# Patient Record
Sex: Male | Born: 1970 | Race: White | Hispanic: No | Marital: Married | Smoking: Never smoker
Health system: Southern US, Community
[De-identification: ages and names within clinical notes are randomized; demographics above are authoritative.]

## PROBLEM LIST (undated history)

## (undated) DIAGNOSIS — F32A Depression, unspecified: Secondary | ICD-10-CM

## (undated) DIAGNOSIS — F419 Anxiety disorder, unspecified: Secondary | ICD-10-CM

## (undated) HISTORY — DX: Depression, unspecified: F32.A

## (undated) HISTORY — PX: WISDOM TOOTH EXTRACTION: SHX21

## (undated) HISTORY — DX: Anxiety disorder, unspecified: F41.9

---

## 2005-01-28 ENCOUNTER — Inpatient Hospital Stay (HOSPITAL_COMMUNITY): Admission: EM | Admit: 2005-01-28 | Discharge: 2005-01-31 | Payer: Self-pay | Admitting: Emergency Medicine

## 2007-01-22 IMAGING — CT CT NECK W/ CM
3 series · 8 of 14 positions shown, 9 images · IV contrast (100 ML OMNI 300)
Comparison: none

CLINICAL DATA: Recent left lower wisdom tooth removal with development of dental abscess and severe pain. 
NECK CT WITH CONTRAST:
TECHNIQUE: Multidetector CT imaging of the neck was performed following the standard protocol during administration of intravenous contrast.
Contrast:  100 cc Omnipaque 300 IV. 
CT demonstrates extensive multiloculated abscess formation at the site of tooth extraction involving tooth #17 of the left mandible.  At the site of tooth extraction, breakthrough is noted in the cortex of the mandible, both along its outer and inner aspects. Extensive adjacent abscess formation is present in the soft tissues, particularly deep to the mandible where a component of abscess measures 2.9 x 5.2 cm in greatest transverse dimensions.   Abscess also tracts up superiorly lateral to the mandible along the plain of the masseter muscle with maximum height of the lateral abscess measuring 5 cm on coronal reconstructions. The abscess also borders upon the anterior aspect of the parotid gland.  The deep portion of the abscess does cause mass effect on the airway to the right without significant narrowing of the oral pharyngeal airway.  The paranasal sinuses show normal aeration.

[Series 2: neck · axial · 0.43mm/px · z∈[-260,-173]mm · 2 of 69 slices shown, 3 images]
[im 23/69  soft-tissue]
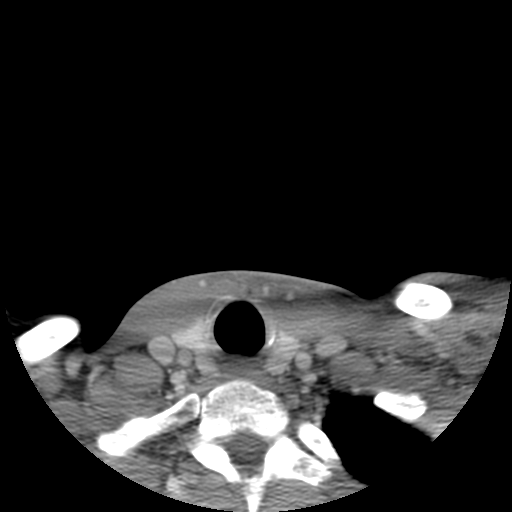
[im 23/69  bone]
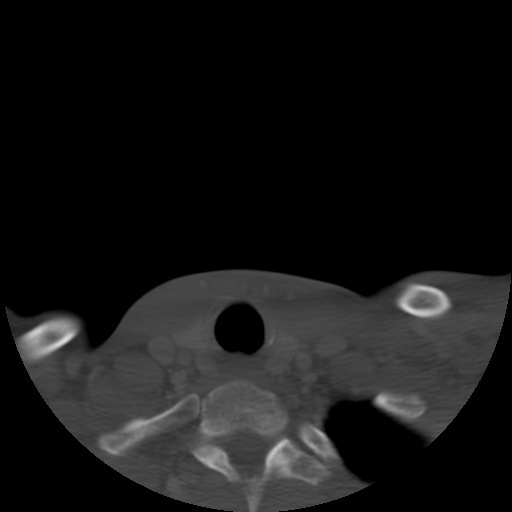
[im 46/69  bone]
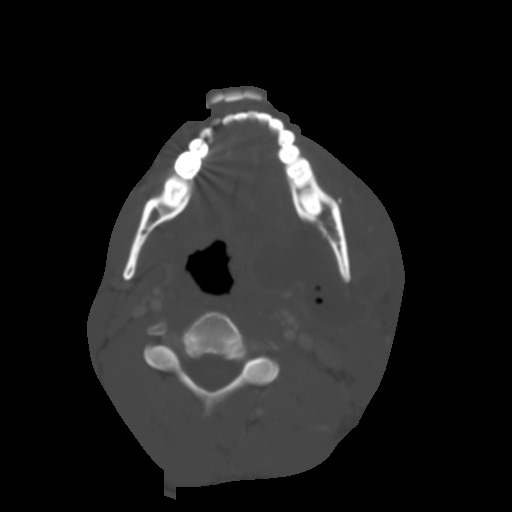

[Series 103: reformatted · sagittal · 0.43mm/px · 3 of 85 slices shown (1 of 2)]
[im 22/85  bone]
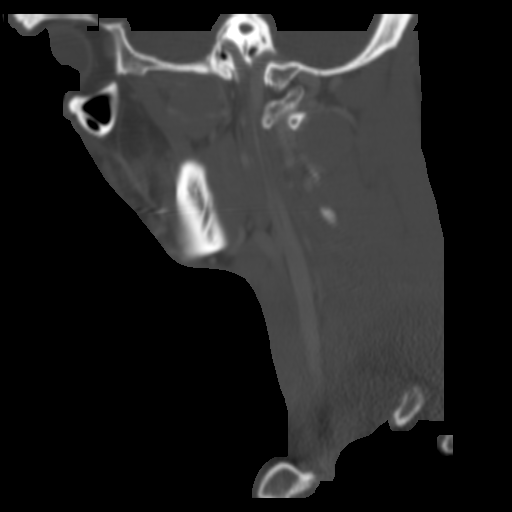
[im 43/85  bone]
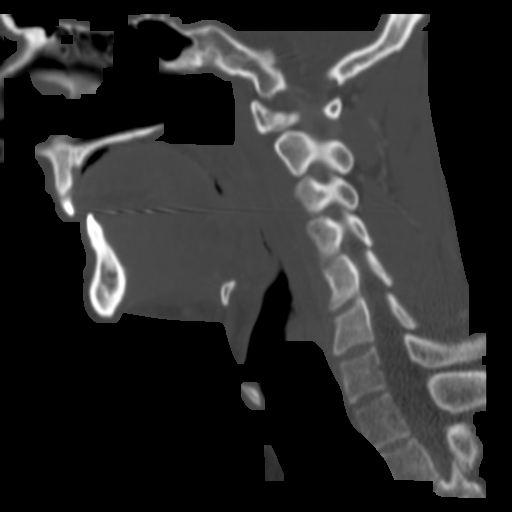
[im 64/85  bone]
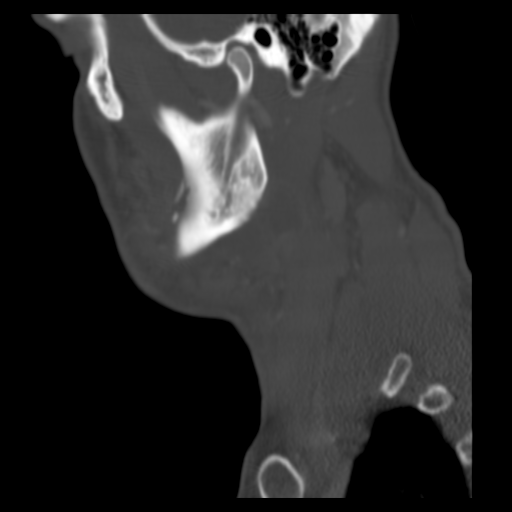

[Series 104: reformatted · coronal · 0.43mm/px · 3 of 93 slices shown (2 of 2)]
[im 24/93  bone]
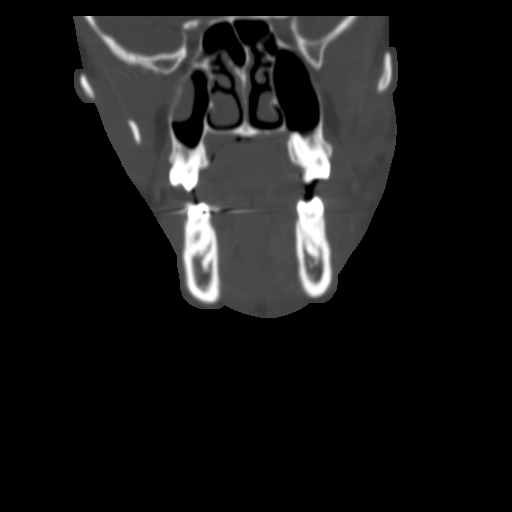
[im 47/93  bone]
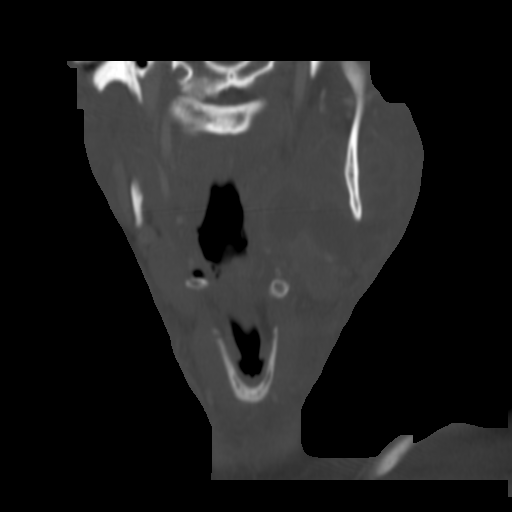
[im 70/93  bone]
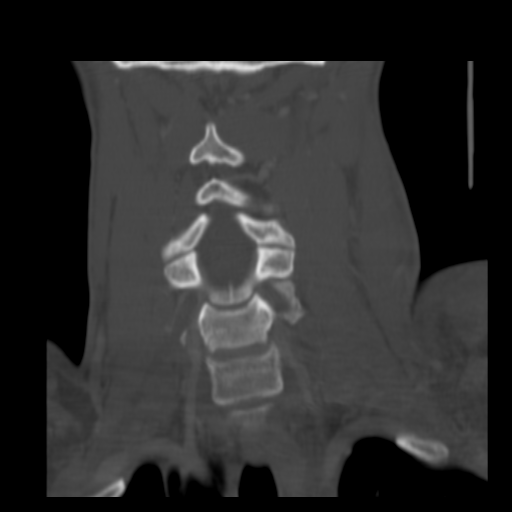

[8 of 14 positions shown; findings below may reference images not displayed]

IMPRESSION: Extensive abscess formation centered around the extracted wisdom molar #17 of the left mandible.  Predominant abscess fluid is deep and medial to the mandible with lateral component also ascending in the face.  There is visible cortical breakthrough at the site of tooth extraction, both along the deep and superficial aspects of the mandible.   Mass effect exerted on the airway without significant airway narrowing.  
Findings were reviewed directly with Dr. Desmond Kofi at the time of the study.

## 2013-06-05 ENCOUNTER — Encounter (HOSPITAL_COMMUNITY): Payer: Self-pay | Admitting: Emergency Medicine

## 2013-06-05 ENCOUNTER — Emergency Department (HOSPITAL_COMMUNITY)
Admission: EM | Admit: 2013-06-05 | Discharge: 2013-06-05 | Disposition: A | Discharge status: Home / Self Care | Payer: BC Managed Care – PPO | Attending: Emergency Medicine | Admitting: Emergency Medicine

## 2013-06-05 DIAGNOSIS — W268XXA Contact with other sharp object(s), not elsewhere classified, initial encounter: Secondary | ICD-10-CM | POA: Insufficient documentation

## 2013-06-05 DIAGNOSIS — Y929 Unspecified place or not applicable: Secondary | ICD-10-CM | POA: Insufficient documentation

## 2013-06-05 DIAGNOSIS — Y9389 Activity, other specified: Secondary | ICD-10-CM | POA: Insufficient documentation

## 2013-06-05 DIAGNOSIS — S61019A Laceration without foreign body of unspecified thumb without damage to nail, initial encounter: Secondary | ICD-10-CM

## 2013-06-05 DIAGNOSIS — S61209A Unspecified open wound of unspecified finger without damage to nail, initial encounter: Secondary | ICD-10-CM | POA: Insufficient documentation

## 2013-06-05 NOTE — ED Notes (Signed)
Pt given water and crackers with peanut butter per EDNP request.  Tolerating well.

## 2013-06-05 NOTE — ED Notes (Signed)
Pt has laceration to left thumb, seen at urgent care told to come here for eval of nerve damage. Thumb wrapped in Kerlex, no bleeding.

## 2013-06-05 NOTE — ED Provider Notes (Signed)
CSN: [id number]     Arrival date & time 06/05/13  1806 History  This chart was scribed for non-physician practitioner, Glendell Docker, FNP,working with Blanchard Kelch, MD, by Marlowe Kays, ED Scribe.  This patient was seen in room WTR5/WTR5 and the patient's care was started at 6:20 PM.  Chief Complaint  Patient presents with  . Laceration   The history is provided by the patient. No language interpreter was used.   HPI Comments:  Jonathan [name] is a 43 y.o. male who presents to the Emergency Department complaining of a laceration to his left thumb while slicing an avocado approximately one hour ago. Pt states he went to [facility] and they referred him here because they were concerned about him having "nerve damage". He reports moderate bleeding that is now controlled. He denies weakness, numbness or tingling of the thumb. He states his last tetanus vaccination was five years ago in 2010.   History reviewed. No pertinent past medical history. Past Surgical History  Procedure Laterality Date  . Wisdom tooth extraction     No family history on file. History  Substance Use Topics  . Smoking status: Never Smoker   . Smokeless tobacco: Not on file  . Alcohol Use: No    Review of Systems  Skin: Positive for wound (laceration to left thumb).  Neurological: Negative for weakness and numbness.  All other systems reviewed and are negative.   Allergies  Review of patient's allergies indicates no known allergies.  Home Medications   Prior to Admission medications   Not on File   Triage Vitals: BP 107/69  Pulse 62  Temp(Src) 98.5 F (36.9 C) (Oral)  SpO2 100% Physical Exam  Nursing note and vitals reviewed. Constitutional: He is oriented to person, place, and time. He appears well-developed and well-nourished.  HENT:  Head: Normocephalic and atraumatic.  Eyes: EOM are normal.  Neck: Normal range of motion.  Cardiovascular: Normal rate, regular rhythm and  normal heart sounds.   Pulmonary/Chest: Effort normal and breath sounds normal. No respiratory distress. He has no wheezes. He has no rales.  Musculoskeletal: Normal range of motion.  Normal ROM of the thumb.  Neurological: He is alert and oriented to person, place, and time.  Sensation intact.  Skin: Skin is warm and dry.  Psychiatric: He has a normal mood and affect. His behavior is normal.    ED Course  Procedures (including critical care time) DIAGNOSTIC STUDIES: Oxygen Saturation is 100% on RA, normal by my interpretation.   COORDINATION OF CARE: 6:24 PM- Will numb the finger and further evaluate if DermaBond or sutures are needed. Pt verbalizes understanding and agrees to plan.  LACERATION REPAIR PROCEDURE NOTE The patient's identification was confirmed and consent was obtained. This procedure was performed by Glendell Docker, FNP at 6:28 PM. Site: right thumb pad Sterile procedures observed Anesthetic used (type and amt): Lidocaine 1% without Epinephrine (2 mL) Suture type/size: DermaBond Length: 2.5 cm  Complexity: simple Tetanus UTD Site anesthetized, irrigated with NS, explored without evidence of foreign body, wound well approximated, site covered with dry, sterile dressing.  Patient tolerated procedure well without complications. Instructions for care discussed verbally and patient provided with additional written instructions for homecare and f/u.  Medications - No data to display  Labs Review Labs Reviewed - No data to display  Imaging Review No results found.   EKG Interpretation None      MDM   Final diagnoses:  Thumb laceration  Wound cleaned and closed without any problem. Pt is okay to follow up as needed:nuerovascularly intact  I personally performed the services described in this documentation, which was scribed in my presence. The recorded information has been reviewed and is accurate.    Glendell Docker, NP 06/05/13 1933

## 2013-06-05 NOTE — Discharge Instructions (Signed)
Laceration Care, Adult °A laceration is a cut that goes through all layers of the skin. The cut goes into the tissue beneath the skin. °HOME CARE °For stitches (sutures) or staples: °· Keep the cut clean and dry. °· If you have a bandage (dressing), change it at least once a day. Change the bandage if it gets wet or dirty, or as told by your doctor. °· Wash the cut with soap and water 2 times a day. Rinse the cut with water. Pat it dry with a clean towel. °· Put a thin layer of medicated cream on the cut as told by your doctor. °· You may shower after the first 24 hours. Do not soak the cut in water until the stitches are removed. °· Only take medicines as told by your doctor. °· Have your stitches or staples removed as told by your doctor. °For skin adhesive strips: °· Keep the cut clean and dry. °· Do not get the strips wet. You may take a bath, but be careful to keep the cut dry. °· If the cut gets wet, pat it dry with a clean towel. °· The strips will fall off on their own. Do not remove the strips that are still stuck to the cut. °For wound glue: °· You may shower or take baths. Do not soak or scrub the cut. Do not swim. Avoid heavy sweating until the glue falls off on its own. After a shower or bath, pat the cut dry with a clean towel. °· Do not put medicine on your cut until the glue falls off. °· If you have a bandage, do not put tape over the glue. °· Avoid lots of sunlight or tanning lamps until the glue falls off. Put sunscreen on the cut for the first year to reduce your scar. °· The glue will fall off on its own. Do not pick at the glue. °You may need a tetanus shot if: °· You cannot remember when you had your last tetanus shot. °· You have never had a tetanus shot. °If you need a tetanus shot and you choose not to have one, you may get tetanus. Sickness from tetanus can be serious. °GET HELP RIGHT AWAY IF:  °· Your pain does not get better with medicine. °· Your arm, hand, leg, or foot loses feeling  (numbness) or changes color. °· Your cut is bleeding. °· Your joint feels weak, or you cannot use your joint. °· You have painful lumps on your body. °· Your cut is red, puffy (swollen), or painful. °· You have a red line on the skin near the cut. °· You have yellowish-white fluid (pus) coming from the cut. °· You have a fever. °· You have a bad smell coming from the cut or bandage. °· Your cut breaks open before or after stitches are removed. °· You notice something coming out of the cut, such as wood or glass. °· You cannot move a finger or toe. °MAKE SURE YOU:  °· Understand these instructions. °· Will watch your condition. °· Will get help right away if you are not doing well or get worse. °Document Released: 06/18/2007 Document Revised: 03/24/2011 Document Reviewed: 06/25/2010 °ExitCare® Patient Information ©2014 [employer]. ° ° ° ° °

## 2013-06-05 NOTE — ED Provider Notes (Signed)
Medical screening examination/treatment/procedure(s) were performed by non-physician practitioner and as supervising physician I was immediately available for consultation/collaboration.   EKG Interpretation None        Blanchard Kelch, MD 06/05/13 2328

## 2013-06-05 NOTE — ED Notes (Signed)
Seen at urgent care- requested to come here- concern about nerve damage

## 2017-06-16 ENCOUNTER — Ambulatory Visit: Payer: Self-pay | Admitting: General Surgery

## 2019-07-20 ENCOUNTER — Ambulatory Visit: Payer: BC Managed Care – PPO | Admitting: Plastic Surgery

## 2019-07-20 ENCOUNTER — Encounter: Payer: Self-pay | Admitting: Plastic Surgery

## 2019-07-20 ENCOUNTER — Other Ambulatory Visit: Payer: Self-pay

## 2019-07-20 DIAGNOSIS — D179 Benign lipomatous neoplasm, unspecified: Secondary | ICD-10-CM

## 2019-07-20 NOTE — Progress Notes (Signed)
   Referring Provider Jari Pigg, MD [address] [place],  [place]   CC:  Chief Complaint  Patient presents with  . Consult      Jonathan [name] is an 49 y.o. male.  HPI: Patient is here to discuss a posterior scalp mass.  Is been present for years but is recently been growing.  He has been followed by dermatologist but wants to discuss excision.  It is intermittently painful depending on how he lays on it.  Due to the recent growth he is motivated to have it taken out.  No Known Allergies  Outpatient Encounter Medications as of 07/20/2019  Medication Sig  . buPROPion (WELLBUTRIN XL) 150 MG 24 hr tablet Take by mouth.  . citalopram (CELEXA) 40 MG tablet Take 40 mg by mouth daily.  Marland Kitchen levocetirizine (XYZAL) 5 MG tablet SMARTSIG:1 Tablet(s) By Mouth Every Evening  . EPINEPHrine 0.3 mg/0.3 mL IJ SOAJ injection SMARTSIG:Injection As Directed (Patient not taking: Reported on 07/20/2019)   No facility-administered encounter medications on file as of 07/20/2019.     No past medical history on file.  Past Surgical History:  Procedure Laterality Date  . WISDOM TOOTH EXTRACTION      No family history on file.  Social History   Social History Narrative  . Not on file     Review of Systems General: Denies fevers, chills, weight loss CV: Denies chest pain, shortness of breath, palpitations  Physical Exam Vitals with BMI 07/20/2019 06/05/2013  Height 5\' 8"  -  Weight 154 lbs -  BMI 06.00 -  Systolic 459 977  Diastolic 66 69  Pulse 69 62    General:  No acute distress,  Alert and oriented, Non-Toxic, Normal speech and affect Posterior scalp shows a 10 to 12 cm soft tissue mass above the hairline.  It does feel a bit fixed superiorly but it is, hard to tell for sure.  There is no obvious overlying skin changes.  Assessment/Plan Patient presents with a chronic soft tissue mass in the occipital scalp area that is recently been growing in size.  He has had it imaged before  and it was believed to be a benign lipoma but that has been several years ago.  I like to reimage to ensure no deeper involvement.  We will plan to get the CT scan of the head and if all looks okay we will plan to excise under general anesthesia.  We discussed the risk that include bleeding, infection, damage to surrounding structures, need for additional procedures.  We discussed the potential for recurrence.  We discussed what to expect postoperatively.  All of his questions were answered.  Jonathan [name] 07/20/2019, 1:40 PM

## 2019-07-29 ENCOUNTER — Encounter: Payer: Self-pay | Admitting: Gastroenterology

## 2019-08-22 ENCOUNTER — Ambulatory Visit (HOSPITAL_COMMUNITY)
Admission: RE | Admit: 2019-08-22 | Discharge: 2019-08-22 | Disposition: A | Discharge status: Home / Self Care | Payer: BC Managed Care – PPO | Source: Ambulatory Visit | Attending: Plastic Surgery | Admitting: Plastic Surgery

## 2019-08-22 ENCOUNTER — Other Ambulatory Visit: Payer: Self-pay

## 2019-08-22 ENCOUNTER — Telehealth: Payer: Self-pay | Admitting: Plastic Surgery

## 2019-08-22 DIAGNOSIS — D179 Benign lipomatous neoplasm, unspecified: Secondary | ICD-10-CM | POA: Insufficient documentation

## 2019-08-22 NOTE — Telephone Encounter (Signed)
Patient called to advise CT dept didn't have his insurance information. Advised his card is scanned into his chart so they should be able to pull his ins information and process it through insurance. Prior Approval was obtained by our prior to scheduling. Patient will call their dept back ca

## 2019-09-05 ENCOUNTER — Other Ambulatory Visit: Payer: Self-pay

## 2019-09-05 ENCOUNTER — Encounter: Payer: Self-pay | Admitting: Gastroenterology

## 2019-09-05 ENCOUNTER — Ambulatory Visit (AMBULATORY_SURGERY_CENTER): Payer: Self-pay | Admitting: *Deleted

## 2019-09-05 VITALS — Ht 68.0 in | Wt 155.0 lb

## 2019-09-05 DIAGNOSIS — Z1211 Encounter for screening for malignant neoplasm of colon: Secondary | ICD-10-CM

## 2019-09-05 NOTE — Progress Notes (Addendum)
No egg or soy allergy known to patient  No issues with past sedation with any surgeries or procedures no intubation problems in the past  No FH of Malignant Hyperthermia No diet pills per patient No home 02 use per patient  No blood thinners per patient  Pt denies issues with constipation  No A fib or A flutter  EMMI video to pt or via MyChart  COVID 19 guidelines implemented in PV today with Pt and RN   Patient had Golytely from a Colonoscopy he could never get scheduled from [place].  Due to the COVID-19 pandemic we are asking patients to follow these guidelines. Please only bring one care partner. Please be aware that your care partner may wait in the car in the parking lot or if they feel like they will be too hot to wait in the car, they may wait in the lobby on the 4th floor. All care partners are required to wear a mask the entire time (we do not have any that we can provide them), they need to practice social distancing, and we will do a Covid check for all patient's and care partners when you arrive. Also we will check their temperature and your temperature. If the care partner waits in their car they need to stay in the parking lot the entire time and we will call them on their cell phone when the patient is ready for discharge so they can bring the car to the front of the building. Also all patient's will need to wear a mask into building.

## 2019-09-06 ENCOUNTER — Telehealth: Payer: Self-pay | Admitting: Gastroenterology

## 2019-09-06 DIAGNOSIS — Z1211 Encounter for screening for malignant neoplasm of colon: Secondary | ICD-10-CM

## 2019-09-06 MED ORDER — PLENVU 140 G PO SOLR
1.0000 | ORAL | 0 refills | Status: DC
Start: 2019-09-06 — End: 2019-09-30

## 2019-09-06 NOTE — Telephone Encounter (Signed)
Family has informed pt of lower volume preps than Golytely - After discussing prep choices with pt, he decided on Plenvu- will mail Plenvu Coupon and new Plenvu instructions to pt - verified address- we went over how to use plenvu today and encouraged pt to call with questions once he receives his new paper work in the mail -Plenvu sent to Pharmacy - pt and I discussed Plenvu cost, No PA needed- Coupon to [place] code to Pharmacy with script today

## 2019-09-21 ENCOUNTER — Ambulatory Visit (INDEPENDENT_AMBULATORY_CARE_PROVIDER_SITE_OTHER): Payer: BC Managed Care – PPO | Admitting: Surgical

## 2019-09-21 ENCOUNTER — Encounter: Payer: Self-pay | Admitting: Surgical

## 2019-09-21 ENCOUNTER — Other Ambulatory Visit: Payer: Self-pay

## 2019-09-21 VITALS — BP 93/63 | HR 62 | Temp 98.0°F | Ht 68.0 in | Wt 153.6 lb

## 2019-09-21 DIAGNOSIS — D179 Benign lipomatous neoplasm, unspecified: Secondary | ICD-10-CM

## 2019-09-21 MED ORDER — ONDANSETRON HCL 4 MG PO TABS: 4.0000 mg | ORAL_TABLET | Freq: Three times a day (TID) | ORAL | 0 refills | Status: AC | PRN

## 2019-09-21 MED ORDER — HYDROCODONE-ACETAMINOPHEN 5-325 MG PO TABS
1.0000 | ORAL_TABLET | Freq: Four times a day (QID) | ORAL | 0 refills | Status: AC | PRN
Start: 2019-09-21 — End: 2019-09-26

## 2019-09-21 NOTE — H&P (View-Only) (Signed)
Patient ID: Jonathan [name], male    DOB: 1970/09/07, 49 y.o.   MRN: [id number]  Chief Complaint  Patient presents with  . 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  male  with a history of a posterior scalp mass that has been present for years, recently growing.  He presents for preoperative evaluation for upcoming procedure, excision of posterior scalp mass, scheduled for 10/14/2019 with Dr. Claudia Desanctis  The patient has not had problems with anesthesia. No history of DVT/PE.  No family history of DVT/PE.  No family or personal history of bleeding or clotting disorders.  Patient is not currently taking any blood thinners.  No history of CVA/MI.   Summary of Previous Visit: Posterior scalp mass that has been present for years but has been recently growing, has been followed by dermatologist.  Mass is intermittently painful depending on how he lays on it.  Patient previously had this area imaged and it was believed to be a benign lipoma, patient had additional CT scan on 08/22/2019 which showed interval growth of her macroscopic fat-containing lesion within the occipital scalp measuring 5 cm in greatest dimension.  Past medical history of anxiety, depression. Reports recently being diagnosed with prediabetes, however this has resolved with diet and exercise and his A1c is now normal per pt.   Past Medical History: Allergies: No Known Allergies  Current Medications:  Current Outpatient Medications:  .  buPROPion (WELLBUTRIN XL) 150 MG 24 hr tablet, Take by mouth., Disp: , Rfl:  .  citalopram (CELEXA) 40 MG tablet, Take 40 mg by mouth daily., Disp: , Rfl:  .  EPINEPHrine 0.3 mg/0.3 mL IJ SOAJ injection, SMARTSIG:Injection As Directed, Disp: , Rfl:  .  levocetirizine (XYZAL) 5 MG tablet, SMARTSIG:1 Tablet(s) By Mouth Every Evening, Disp: , Rfl:  .  PEG-KCl-NaCl-NaSulf-Na Asc-C (PLENVU) 140 g SOLR, Take 1 kit by  mouth as directed. Manufacturer's coupon Universal coupon code:BIN: P2366821; GROUP: [id number]; PCN: [id number]; ID: [id number]; PAY NO MORE $50, Disp: 1 each, Rfl: 0 .  HYDROcodone-acetaminophen (NORCO) 5-325 MG tablet, Take 1 tablet by mouth every 6 (six) hours as needed for up to 5 days for severe pain., Disp: 20 tablet, Rfl: 0 .  ondansetron (ZOFRAN) 4 MG tablet, Take 1 tablet (4 mg total) by mouth every 8 (eight) hours as needed for nausea or vomiting., Disp: 20 tablet, Rfl: 0 .  polyethylene glycol (GOLYTELY) 236 g solution, Take 4,000 mLs by mouth once., Disp: , Rfl:   Past Medical Problems: Past Medical History:  Diagnosis Date  . Anxiety   . Depression     Past Surgical History: Past Surgical History:  Procedure Laterality Date  . WISDOM TOOTH EXTRACTION      Social History: Social History   Socioeconomic History  . Marital status: Married    Spouse name: Not on file  . Number of children: Not on file  . Years of education: Not on file  . Highest education level: Not on file  Occupational History  . Not on file  Tobacco Use  . Smoking status: Never Smoker  . Smokeless tobacco: Never Used  Substance and Sexual Activity  . Alcohol use: No  . Drug use: No  . Sexual activity: Not on file  Other Topics Concern  . Not on file  Social History Narrative  . Not on file  Social Determinants of Health   Financial Resource Strain:   . Difficulty of Paying Living Expenses: Not on file  Food Insecurity:   . Worried About Running Out of Food in the Last Year: Not on file  . Ran Out of Food in the Last Year: Not on file  Transportation Needs:   . Lack of Transportation (Medical): Not on file  . Lack of Transportation (Non-Medical): Not on file  Physical Activity:   . Days of Exercise per Week: Not on file  . Minutes of Exercise per Session: Not on file  Stress:   . Feeling of Stress : Not on file  Social Connections:   . Frequency of Communication with Friends and Family:  Not on file  . Frequency of Social Gatherings with Friends and Family: Not on file  . Attends Religious Services: Not on file  . Active Member of Clubs or Organizations: Not on file  . Attends Club or Organization Meetings: Not on file  . Marital Status: Not on file  Intimate Partner Violence:   . Fear of Current or Ex-Partner: Not on file  . Emotionally Abused: Not on file  . Physically Abused: Not on file  . Sexually Abused: Not on file    Family History: Family History  Problem Relation Age of Onset  . Colon cancer Neg Hx   . Esophageal cancer Neg Hx   . Rectal cancer Neg Hx   . Stomach cancer Neg Hx     Review of Systems: Review of Systems  Constitutional: Negative.   Respiratory: Negative.   Cardiovascular: Negative.   Gastrointestinal: Negative.     Physical Exam: Vital Signs BP 93/63 (BP Location: Left Arm, Patient Position: Sitting, Cuff Size: Large)   Pulse 62   Temp 98 F (36.7 C) (Oral)   Ht 5' 8" (1.727 m)   Wt 153 lb 9.6 oz (69.7 kg)   SpO2 100%   BMI 23.35 kg/m   Physical Exam Constitutional:      General: He is not in acute distress.    Appearance: Normal appearance. He is normal weight. He is not ill-appearing.  HENT:     Head: Atraumatic.   Eyes:     Pupils: Pupils are equal, round, and reactive to light.  Cardiovascular:     Rate and Rhythm: Normal rate and regular rhythm.     Pulses: Normal pulses.     Heart sounds: Normal heart sounds.  Pulmonary:     Effort: Pulmonary effort is normal.     Breath sounds: Normal breath sounds.  Abdominal:     General: Abdomen is flat. There is no distension.     Palpations: Abdomen is soft.     Tenderness: There is no abdominal tenderness.  Musculoskeletal:        General: No swelling.     Right lower leg: No edema.     Left lower leg: No edema.  Skin:    General: Skin is warm and dry.  Neurological:     General: No focal deficit present.     Mental Status: He is alert and oriented to person,  place, and time.  Psychiatric:        Mood and Affect: Mood normal.        Behavior: Behavior normal.    Assessment/Plan: The patient is scheduled for excision of posterior scalp mass with Dr. Pace.  Risks, benefits, and alternatives of procedure discussed, questions answered and consent obtained.    Smoking Status: Non-smoker;   Counseling Given? N/A  Caprini Score:, Low 2; Risk Factors include: Age and length of planned surgery. Recommendation for mechanical prophylaxis during surgery. Encourage early ambulation.   Post-op Rx sent to pharmacy: Norco, Zofran  Patient was provided with the General Surgical Risk consent document and Pain Medication Agreement prior to their appointment.  They had adequate time to read through the risk consent documents and Pain Medication Agreement. We also discussed them in person together during this preop appointment. All of their questions were answered to their satisfaction.  Recommended calling if they have any further questions.  Risk consent form and Pain Medication Agreement to be scanned into patient's chart.   Electronically signed by: Carola Rhine Violet Cart, PA-C 09/21/2019 9:18 AM

## 2019-09-21 NOTE — Progress Notes (Signed)
Patient ID: Jonathan [name], male    DOB: [birth year], 49 y.o.   MRN: [id number]  Chief Complaint  Patient presents with  . 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  male  with a history of a posterior scalp mass that has been present for years, recently growing.  He presents for preoperative evaluation for upcoming procedure, excision of posterior scalp mass, scheduled for 10/14/2019 with Dr. Claudia Desanctis  The patient has not had problems with anesthesia. No history of DVT/PE.  No family history of DVT/PE.  No family or personal history of bleeding or clotting disorders.  Patient is not currently taking any blood thinners.  No history of CVA/MI.   Summary of Previous Visit: Posterior scalp mass that has been present for years but has been recently growing, has been followed by dermatologist.  Mass is intermittently painful depending on how he lays on it.  Patient previously had this area imaged and it was believed to be a benign lipoma, patient had additional CT scan on 08/22/2019 which showed interval growth of her macroscopic fat-containing lesion within the occipital scalp measuring 5 cm in greatest dimension.  Past medical history of anxiety, depression. Reports recently being diagnosed with prediabetes, however this has resolved with diet and exercise and his A1c is now normal per pt.   Past Medical History: Allergies: No Known Allergies  Current Medications:  Current Outpatient Medications:  .  buPROPion (WELLBUTRIN XL) 150 MG 24 hr tablet, Take by mouth., Disp: , Rfl:  .  citalopram (CELEXA) 40 MG tablet, Take 40 mg by mouth daily., Disp: , Rfl:  .  EPINEPHrine 0.3 mg/0.3 mL IJ SOAJ injection, SMARTSIG:Injection As Directed, Disp: , Rfl:  .  levocetirizine (XYZAL) 5 MG tablet, SMARTSIG:1 Tablet(s) By Mouth Every Evening, Disp: , Rfl:  .  PEG-KCl-NaCl-NaSulf-Na Asc-C (PLENVU) 140 g SOLR, Take 1 kit by  mouth as directed. Manufacturer's coupon Universal coupon code:BIN: P2366821; GROUP: [id number]; PCN: [id number]; ID: [id number]; PAY NO MORE $50, Disp: 1 each, Rfl: 0 .  HYDROcodone-acetaminophen (NORCO) 5-325 MG tablet, Take 1 tablet by mouth every 6 (six) hours as needed for up to 5 days for severe pain., Disp: 20 tablet, Rfl: 0 .  ondansetron (ZOFRAN) 4 MG tablet, Take 1 tablet (4 mg total) by mouth every 8 (eight) hours as needed for nausea or vomiting., Disp: 20 tablet, Rfl: 0 .  polyethylene glycol (GOLYTELY) 236 g solution, Take 4,000 mLs by mouth once., Disp: , Rfl:   Past Medical Problems: Past Medical History:  Diagnosis Date  . Anxiety   . Depression     Past Surgical History: Past Surgical History:  Procedure Laterality Date  . WISDOM TOOTH EXTRACTION      Social History: Social History   Socioeconomic History  . Marital status: Married    Spouse name: Not on file  . Number of children: Not on file  . Years of education: Not on file  . Highest education level: Not on file  Occupational History  . Not on file  Tobacco Use  . Smoking status: Never Smoker  . Smokeless tobacco: Never Used  Substance and Sexual Activity  . Alcohol use: No  . Drug use: No  . Sexual activity: Not on file  Other Topics Concern  . Not on file  Social History Narrative  . Not on file  Social Determinants of Health   Financial Resource Strain:   . Difficulty of Paying Living Expenses: Not on file  Food Insecurity:   . Worried About Charity fundraiser in the Last Year: Not on file  . Ran Out of Food in the Last Year: Not on file  Transportation Needs:   . Lack of Transportation (Medical): Not on file  . Lack of Transportation (Non-Medical): Not on file  Physical Activity:   . Days of Exercise per Week: Not on file  . Minutes of Exercise per Session: Not on file  Stress:   . Feeling of Stress : Not on file  Social Connections:   . Frequency of Communication with Friends and Family:  Not on file  . Frequency of Social Gatherings with Friends and Family: Not on file  . Attends Religious Services: Not on file  . Active Member of Clubs or Organizations: Not on file  . Attends Archivist Meetings: Not on file  . Marital Status: Not on file  Intimate Partner Violence:   . Fear of Current or Ex-Partner: Not on file  . Emotionally Abused: Not on file  . Physically Abused: Not on file  . Sexually Abused: Not on file    Family History: Family History  Problem Relation Age of Onset  . Colon cancer Neg Hx   . Esophageal cancer Neg Hx   . Rectal cancer Neg Hx   . Stomach cancer Neg Hx     Review of Systems: Review of Systems  Constitutional: Negative.   Respiratory: Negative.   Cardiovascular: Negative.   Gastrointestinal: Negative.     Physical Exam: Vital Signs BP 93/63 (BP Location: Left Arm, Patient Position: Sitting, Cuff Size: Large)   Pulse 62   Temp 98 F (36.7 C) (Oral)   Ht 5' 8" (1.727 m)   Wt 153 lb 9.6 oz (69.7 kg)   SpO2 100%   BMI 23.35 kg/m   Physical Exam Constitutional:      General: He is not in acute distress.    Appearance: Normal appearance. He is normal weight. He is not ill-appearing.  HENT:     Head: Atraumatic.   Eyes:     Pupils: Pupils are equal, round, and reactive to light.  Cardiovascular:     Rate and Rhythm: Normal rate and regular rhythm.     Pulses: Normal pulses.     Heart sounds: Normal heart sounds.  Pulmonary:     Effort: Pulmonary effort is normal.     Breath sounds: Normal breath sounds.  Abdominal:     General: Abdomen is flat. There is no distension.     Palpations: Abdomen is soft.     Tenderness: There is no abdominal tenderness.  Musculoskeletal:        General: No swelling.     Right lower leg: No edema.     Left lower leg: No edema.  Skin:    General: Skin is warm and dry.  Neurological:     General: No focal deficit present.     Mental Status: He is alert and oriented to person,  place, and time.  Psychiatric:        Mood and Affect: Mood normal.        Behavior: Behavior normal.    Assessment/Plan: The patient is scheduled for excision of posterior scalp mass with Dr. Claudia Desanctis.  Risks, benefits, and alternatives of procedure discussed, questions answered and consent obtained.    Smoking Status: Non-smoker;  Counseling Given? N/A  Caprini Score:, Low 2; Risk Factors include: Age and length of planned surgery. Recommendation for mechanical prophylaxis during surgery. Encourage early ambulation.   Post-op Rx sent to pharmacy: Norco, Zofran  Patient was provided with the General Surgical Risk consent document and Pain Medication Agreement prior to their appointment.  They had adequate time to read through the risk consent documents and Pain Medication Agreement. We also discussed them in person together during this preop appointment. All of their questions were answered to their satisfaction.  Recommended calling if they have any further questions.  Risk consent form and Pain Medication Agreement to be scanned into patient's chart.   Electronically signed by: Carola Rhine Ambra Haverstick, PA-C 09/21/2019 9:18 AM

## 2019-09-30 ENCOUNTER — Encounter: Payer: Self-pay | Admitting: Gastroenterology

## 2019-09-30 ENCOUNTER — Other Ambulatory Visit: Payer: Self-pay

## 2019-09-30 ENCOUNTER — Ambulatory Visit (AMBULATORY_SURGERY_CENTER): Payer: BC Managed Care – PPO | Admitting: Gastroenterology

## 2019-09-30 VITALS — BP 98/61 | HR 59 | Temp 98.2°F | Resp 11 | Ht 68.0 in | Wt 155.0 lb

## 2019-09-30 DIAGNOSIS — D120 Benign neoplasm of cecum: Secondary | ICD-10-CM

## 2019-09-30 DIAGNOSIS — Z1211 Encounter for screening for malignant neoplasm of colon: Secondary | ICD-10-CM

## 2019-09-30 DIAGNOSIS — D122 Benign neoplasm of ascending colon: Secondary | ICD-10-CM | POA: Diagnosis not present

## 2019-09-30 DIAGNOSIS — D128 Benign neoplasm of rectum: Secondary | ICD-10-CM

## 2019-09-30 DIAGNOSIS — K6289 Other specified diseases of anus and rectum: Secondary | ICD-10-CM

## 2019-09-30 MED ORDER — SODIUM CHLORIDE 0.9 % IV SOLN: 500.0000 mL | Freq: Once | INTRAVENOUS | Status: AC

## 2019-09-30 NOTE — Progress Notes (Signed)
Called to room to assist during endoscopic procedure.  Patient ID and intended procedure confirmed with present staff. Received instructions for my participation in the procedure from the performing physician.  

## 2019-09-30 NOTE — Discharge Instructions (Signed)
Resume previous medications. Handouts on findings given to patient. (Hemorrhoids, polyps) Await pathology for final recommendations.  YOU HAD AN ENDOSCOPIC PROCEDURE TODAY AT [facility]:   Refer to the procedure report that was given to you for any specific questions about what was found during the examination.  If the procedure report does not answer your questions, please call your gastroenterologist to clarify.  If you requested that your care partner not be given the details of your procedure findings, then the procedure report has been included in a sealed envelope for you to review at your convenience later.  YOU SHOULD EXPECT: Some feelings of bloating in the abdomen. Passage of more gas than usual.  Walking can help get rid of the air that was put into your GI tract during the procedure and reduce the bloating. If you had a lower endoscopy (such as a colonoscopy or flexible sigmoidoscopy) you may notice spotting of blood in your stool or on the toilet paper. If you underwent a bowel prep for your procedure, you may not have a normal bowel movement for a few days.  Please Note:  You might notice some irritation and congestion in your nose or some drainage.  This is from the oxygen used during your procedure.  There is no need for concern and it should clear up in a day or so.  SYMPTOMS TO REPORT IMMEDIATELY:   Following lower endoscopy (colonoscopy or flexible sigmoidoscopy):  Excessive amounts of blood in the stool  Significant tenderness or worsening of abdominal pains  Swelling of the abdomen that is new, acute  Fever of 100F or higher  For urgent or emergent issues, a gastroenterologist can be reached at any hour by calling [phone]. Do not use MyChart messaging for urgent concerns.    DIET:  We do recommend a small meal at first, but then you may proceed to your regular diet.  Drink plenty of fluids but you should avoid alcoholic beverages for 24  hours.  ACTIVITY:  You should plan to take it easy for the rest of today and you should NOT DRIVE or use heavy machinery until tomorrow (because of the sedation medicines used during the test).    FOLLOW UP: Our staff will call the number listed on your records 48-72 hours following your procedure to check on you and address any questions or concerns that you may have regarding the information given to you following your procedure. If we do not reach you, we will leave a message.  We will attempt to reach you two times.  During this call, we will ask if you have developed any symptoms of COVID 19. If you develop any symptoms (ie: fever, flu-like symptoms, shortness of breath, cough etc.) before then, please call [phone].  If you test positive for Covid 19 in the 2 weeks post procedure, please call and report this information to [place].    If any biopsies were taken you will be contacted by phone or by letter within the next 1-3 weeks.  Please call us at [phone] if you have not heard about the biopsies in 3 weeks.    SIGNATURES/CONFIDENTIALITY: You and/or your care partner have signed paperwork which will be entered into your electronic medical record.  These signatures attest to the fact that that the information above on your After Visit Summary has been reviewed and is understood.  Full responsibility of the confidentiality of this discharge information lies with you and/or your care-partner.

## 2019-09-30 NOTE — Progress Notes (Signed)
Pt. Reports no change in his medical or surgical history since his pre-visit 09/05/2019.

## 2019-09-30 NOTE — Progress Notes (Signed)
pt tolerated well. VSS. awake and to recovery. Report given to RN.  

## 2019-09-30 NOTE — Op Note (Signed)
[facility] Patient Name: Jonathan [name] Procedure Date: 09/30/2019 9:38 AM MRN: [id number] Endoscopist: Remo Lipps P. Havery Moros , MD Age: 49 Referring MD:  Date of Birth: August 27, 1970 Gender: Male Account #: [id number] Procedure:                Colonoscopy Indications:              Screening for colorectal malignant neoplasm, This                            is the patient's first colonoscopy Medicines:                Monitored Anesthesia Care Procedure:                Pre-Anesthesia Assessment:                           - Prior to the procedure, a History and Physical                            was performed, and patient medications and                            allergies were reviewed. The patient's tolerance of                            previous anesthesia was also reviewed. The risks                            and benefits of the procedure and the sedation                            options and risks were discussed with the patient.                            All questions were answered, and informed consent                            was obtained. Prior Anticoagulants: The patient has                            taken no previous anticoagulant or antiplatelet                            agents. ASA Grade Assessment: I - A normal, healthy                            patient. After reviewing the risks and benefits,                            the patient was deemed in satisfactory condition to                            undergo the procedure.  After obtaining informed consent, the colonoscope                            was passed under direct vision. Throughout the                            procedure, the patient's blood pressure, pulse, and                            oxygen saturations were monitored continuously. The                            Colonoscope was introduced through the anus and                            advanced to the the cecum, identified  by                            appendiceal orifice and ileocecal valve. The                            colonoscopy was performed without difficulty. The                            patient tolerated the procedure well. The quality                            of the bowel preparation was good. The ileocecal                            valve, appendiceal orifice, and rectum were                            photographed. Scope In: 9:51:42 AM Scope Out: 10:21:08 AM Scope Withdrawal Time: 0 hours 26 minutes 15 seconds  Total Procedure Duration: 0 hours 29 minutes 26 seconds  Findings:                 The perianal and digital rectal examinations were                            normal.                           A diminutive polyp was found in the cecum. The                            polyp was sessile. The polyp was removed with a                            cold snare. Resection and retrieval were complete.                           Two flat and sessile polyps were found in the  ascending colon. The polyps were 3 to 4 mm in size.                            These polyps were removed with a cold snare.                            Resection and retrieval were complete.                           A 3 mm polyp was found in the rectum. The polyp was                            sessile. The polyp was removed with a cold snare.                            Resection and retrieval were complete.                           Anal papillae was hypertrophied. Biopsies were                            taken with a cold forceps for histology to rule out                            AIN.                           Internal hemorrhoids were found during retroflexion.                           The right colon was a bit tortous which prolonged                            withdrawal. The exam was otherwise without                            abnormality. Complications:            No immediate  complications. Estimated blood loss:                            Minimal. Estimated Blood Loss:     Estimated blood loss was minimal. Impression:               - One diminutive polyp in the cecum, removed with a                            cold snare. Resected and retrieved.                           - Two 3 to 4 mm polyps in the ascending colon,                            removed with a cold snare. Resected and retrieved.                           -  One 3 mm polyp in the rectum, removed with a cold                            snare. Resected and retrieved.                           - Anal papillae was hypertrophied. Biopsied to rule                            out AIN.                           - Internal hemorrhoids.                           - The examination was otherwise normal. Recommendation:           - Patient has a contact number available for                            emergencies. The signs and symptoms of potential                            delayed complications were discussed with the                            patient. Return to normal activities tomorrow.                            Written discharge instructions were provided to the                            patient.                           - Resume previous diet.                           - Continue present medications.                           - Await pathology results. Remo Lipps P. Havery Moros, MD 09/30/2019 10:26:48 AM This report has been signed electronically.

## 2019-10-04 ENCOUNTER — Telehealth: Payer: Self-pay | Admitting: *Deleted

## 2019-10-04 NOTE — Telephone Encounter (Signed)
First follow up call attempt.  Message left on vm to call with any questions or concerns.

## 2019-10-04 NOTE — Telephone Encounter (Signed)
  Follow up Call-  Call back number 09/30/2019  Post procedure Call Back phone  # [phone]  Permission to leave phone message Yes  Some recent data might be hidden     Patient questions:  Do you have a fever, pain , or abdominal swelling? No. Pain Score  0 *  Have you tolerated food without any problems? Yes.    Have you been able to return to your normal activities? Yes.    Do you have any questions about your discharge instructions: Diet   No. Medications  No. Follow up visit  No.  Do you have questions or concerns about your Care? No.  Actions: * If pain score is 4 or above: No action needed, pain <4.  1. Have you developed a fever since your procedure? no  2.   Have you had an respiratory symptoms (SOB or cough) since your procedure? no  3.   Have you tested positive for COVID 19 since your procedure no  4.   Have you had any family members/close contacts diagnosed with the COVID 19 since your procedure?  no   If yes to any of these questions please route to Joylene John, RN and Joella Prince, RN

## 2019-10-10 ENCOUNTER — Other Ambulatory Visit: Payer: Self-pay

## 2019-10-10 ENCOUNTER — Encounter (HOSPITAL_BASED_OUTPATIENT_CLINIC_OR_DEPARTMENT_OTHER): Payer: Self-pay | Admitting: Plastic Surgery

## 2019-10-12 ENCOUNTER — Other Ambulatory Visit (HOSPITAL_COMMUNITY)
Admission: RE | Admit: 2019-10-12 | Discharge: 2019-10-12 | Disposition: A | Discharge status: Home / Self Care | Payer: BC Managed Care – PPO | Source: Ambulatory Visit | Attending: Plastic Surgery | Admitting: Plastic Surgery

## 2019-10-12 DIAGNOSIS — Z01812 Encounter for preprocedural laboratory examination: Secondary | ICD-10-CM | POA: Insufficient documentation

## 2019-10-12 DIAGNOSIS — Z20822 Contact with and (suspected) exposure to covid-19: Secondary | ICD-10-CM | POA: Insufficient documentation

## 2019-10-12 LAB — SARS CORONAVIRUS 2 (TAT 6-24 HRS): SARS Coronavirus 2: NEGATIVE

## 2019-10-14 ENCOUNTER — Ambulatory Visit (HOSPITAL_BASED_OUTPATIENT_CLINIC_OR_DEPARTMENT_OTHER): Payer: BC Managed Care – PPO | Admitting: Anesthesiology

## 2019-10-14 ENCOUNTER — Other Ambulatory Visit: Payer: Self-pay

## 2019-10-14 ENCOUNTER — Encounter (HOSPITAL_BASED_OUTPATIENT_CLINIC_OR_DEPARTMENT_OTHER): Payer: Self-pay | Admitting: Plastic Surgery

## 2019-10-14 ENCOUNTER — Ambulatory Visit (HOSPITAL_BASED_OUTPATIENT_CLINIC_OR_DEPARTMENT_OTHER)
Admission: RE | Admit: 2019-10-14 | Discharge: 2019-10-14 | Disposition: A | Discharge status: Home / Self Care | Payer: BC Managed Care – PPO | Attending: Plastic Surgery | Admitting: Plastic Surgery

## 2019-10-14 ENCOUNTER — Encounter (HOSPITAL_BASED_OUTPATIENT_CLINIC_OR_DEPARTMENT_OTHER)
Admission: RE | Disposition: A | Discharge status: Home / Self Care | Payer: Self-pay | Source: Home / Self Care | Attending: Plastic Surgery

## 2019-10-14 DIAGNOSIS — F32A Depression, unspecified: Secondary | ICD-10-CM | POA: Insufficient documentation

## 2019-10-14 DIAGNOSIS — F419 Anxiety disorder, unspecified: Secondary | ICD-10-CM | POA: Diagnosis not present

## 2019-10-14 DIAGNOSIS — Z79899 Other long term (current) drug therapy: Secondary | ICD-10-CM | POA: Diagnosis not present

## 2019-10-14 DIAGNOSIS — D179 Benign lipomatous neoplasm, unspecified: Secondary | ICD-10-CM

## 2019-10-14 DIAGNOSIS — R220 Localized swelling, mass and lump, head: Secondary | ICD-10-CM | POA: Diagnosis not present

## 2019-10-14 HISTORY — PX: EXCISION MASS HEAD: SHX6702

## 2019-10-14 SURGERY — EXCISION, MASS, HEAD
Anesthesia: General | Site: Scalp

## 2019-10-14 MED ORDER — EPHEDRINE SULFATE 50 MG/ML IJ SOLN
INTRAMUSCULAR | Status: DC | PRN
  Administered 2019-10-14 (×2): 10 mg via INTRAVENOUS

## 2019-10-14 MED ORDER — MEPERIDINE HCL 25 MG/ML IJ SOLN: 6.2500 mg | INTRAMUSCULAR | Status: DC | PRN

## 2019-10-14 MED ORDER — OXYCODONE HCL 5 MG PO TABS: 5.0000 mg | ORAL_TABLET | Freq: Once | ORAL | Status: DC | PRN

## 2019-10-14 MED ORDER — PROPOFOL 10 MG/ML IV BOLUS
INTRAVENOUS | Status: AC
  Filled 2019-10-14: qty 20

## 2019-10-14 MED ORDER — MIDAZOLAM HCL 2 MG/2ML IJ SOLN
INTRAMUSCULAR | Status: AC
  Filled 2019-10-14: qty 2

## 2019-10-14 MED ORDER — CEFAZOLIN SODIUM-DEXTROSE 2-4 GM/100ML-% IV SOLN
INTRAVENOUS | Status: AC
  Filled 2019-10-14: qty 100

## 2019-10-14 MED ORDER — SUGAMMADEX SODIUM 200 MG/2ML IV SOLN
INTRAVENOUS | Status: DC | PRN
  Administered 2019-10-14: 200 mg via INTRAVENOUS

## 2019-10-14 MED ORDER — FENTANYL CITRATE (PF) 100 MCG/2ML IJ SOLN
INTRAMUSCULAR | Status: DC | PRN
Start: 2019-10-14 — End: 2019-10-14
  Administered 2019-10-14: 100 ug via INTRAVENOUS

## 2019-10-14 MED ORDER — PROPOFOL 10 MG/ML IV BOLUS
INTRAVENOUS | Status: DC | PRN
  Administered 2019-10-14: 150 mg via INTRAVENOUS

## 2019-10-14 MED ORDER — FENTANYL CITRATE (PF) 100 MCG/2ML IJ SOLN
INTRAMUSCULAR | Status: AC
  Filled 2019-10-14: qty 2

## 2019-10-14 MED ORDER — DEXAMETHASONE SODIUM PHOSPHATE 4 MG/ML IJ SOLN
INTRAMUSCULAR | Status: DC | PRN
  Administered 2019-10-14: 10 mg via INTRAVENOUS

## 2019-10-14 MED ORDER — AMISULPRIDE (ANTIEMETIC) 5 MG/2ML IV SOLN: 10.0000 mg | Freq: Once | INTRAVENOUS | Status: DC | PRN

## 2019-10-14 MED ORDER — ACETAMINOPHEN 10 MG/ML IV SOLN: 1000.0000 mg | Freq: Once | INTRAVENOUS | Status: DC | PRN

## 2019-10-14 MED ORDER — ROCURONIUM BROMIDE 100 MG/10ML IV SOLN
INTRAVENOUS | Status: DC | PRN
  Administered 2019-10-14: 70 mg via INTRAVENOUS

## 2019-10-14 MED ORDER — MIDAZOLAM HCL 5 MG/5ML IJ SOLN
INTRAMUSCULAR | Status: DC | PRN
  Administered 2019-10-14: 2 mg via INTRAVENOUS

## 2019-10-14 MED ORDER — LIDOCAINE HCL (CARDIAC) PF 100 MG/5ML IV SOSY
PREFILLED_SYRINGE | INTRAVENOUS | Status: DC | PRN
  Administered 2019-10-14: 50 mg via INTRAVENOUS

## 2019-10-14 MED ORDER — BACITRACIN 500 UNIT/GM EX OINT
TOPICAL_OINTMENT | CUTANEOUS | Status: DC | PRN
  Administered 2019-10-14: 1 via TOPICAL

## 2019-10-14 MED ORDER — ONDANSETRON HCL 4 MG/2ML IJ SOLN
INTRAMUSCULAR | Status: DC | PRN
  Administered 2019-10-14: 4 mg via INTRAVENOUS

## 2019-10-14 MED ORDER — ACETAMINOPHEN 160 MG/5ML PO SOLN: 325.0000 mg | Freq: Once | ORAL | Status: DC | PRN

## 2019-10-14 MED ORDER — BACITRACIN ZINC 500 UNIT/GM EX OINT
TOPICAL_OINTMENT | CUTANEOUS | Status: AC
  Filled 2019-10-14: qty 28.35

## 2019-10-14 MED ORDER — OXYCODONE HCL 5 MG/5ML PO SOLN: 5.0000 mg | Freq: Once | ORAL | Status: DC | PRN

## 2019-10-14 MED ORDER — FENTANYL CITRATE (PF) 100 MCG/2ML IJ SOLN: 25.0000 ug | INTRAMUSCULAR | Status: DC | PRN

## 2019-10-14 MED ORDER — LACTATED RINGERS IV SOLN: INTRAVENOUS | Status: DC

## 2019-10-14 MED ORDER — CEFAZOLIN SODIUM-DEXTROSE 2-4 GM/100ML-% IV SOLN
2.0000 g | INTRAVENOUS | Status: AC
  Administered 2019-10-14: 2 g via INTRAVENOUS

## 2019-10-14 MED ORDER — LIDOCAINE-EPINEPHRINE 1 %-1:100000 IJ SOLN
INTRAMUSCULAR | Status: DC | PRN
  Administered 2019-10-14: 20 mL via INTRADERMAL

## 2019-10-14 MED ORDER — ACETAMINOPHEN 325 MG PO TABS: 325.0000 mg | ORAL_TABLET | Freq: Once | ORAL | Status: DC | PRN

## 2019-10-14 SURGICAL SUPPLY — 71 items
ADH SKN CLS APL DERMABOND .7 (GAUZE/BANDAGES/DRESSINGS)
APL PRP STRL LF DISP 70% ISPRP (MISCELLANEOUS) ×1
BAND INSRT 18 STRL LF DISP RB (MISCELLANEOUS)
BAND RUBBER #18 3X1/16 STRL (MISCELLANEOUS) IMPLANT
BLADE CLIPPER SURG (BLADE) IMPLANT
BLADE SURG 15 STRL LF DISP TIS (BLADE) ×1 IMPLANT
BLADE SURG 15 STRL SS (BLADE) ×3
CANISTER SUCT 1200ML W/VALVE (MISCELLANEOUS) IMPLANT
CHLORAPREP W/TINT 26 (MISCELLANEOUS) ×3 IMPLANT
CLOSURE WOUND 1/2 X4 (GAUZE/BANDAGES/DRESSINGS)
COVER BACK TABLE 60X90IN (DRAPES) ×3 IMPLANT
COVER MAYO STAND STRL (DRAPES) ×3 IMPLANT
COVER WAND RF STERILE (DRAPES) IMPLANT
DERMABOND ADVANCED (GAUZE/BANDAGES/DRESSINGS)
DERMABOND ADVANCED .7 DNX12 (GAUZE/BANDAGES/DRESSINGS) IMPLANT
DRAPE U-SHAPE 76X120 STRL (DRAPES) ×3 IMPLANT
DRAPE UTILITY XL STRL (DRAPES) ×3 IMPLANT
DRSG PAD ABDOMINAL 8X10 ST (GAUZE/BANDAGES/DRESSINGS) ×2 IMPLANT
DRSG TELFA 3X8 NADH (GAUZE/BANDAGES/DRESSINGS) IMPLANT
ELECT COATED BLADE 2.86 ST (ELECTRODE) IMPLANT
ELECT NDL BLADE 2-5/6 (NEEDLE) ×1 IMPLANT
ELECT NEEDLE BLADE 2-5/6 (NEEDLE) ×3 IMPLANT
ELECT REM PT RETURN 9FT ADLT (ELECTROSURGICAL) ×3
ELECTRODE REM PT RTRN 9FT ADLT (ELECTROSURGICAL) IMPLANT
GAUZE SPONGE 4X4 12PLY STRL LF (GAUZE/BANDAGES/DRESSINGS) ×4 IMPLANT
GAUZE XEROFORM 1X8 LF (GAUZE/BANDAGES/DRESSINGS) IMPLANT
GLOVE BIO SURGEON STRL SZ 6.5 (GLOVE) ×1 IMPLANT
GLOVE BIO SURGEONS STRL SZ 6.5 (GLOVE) ×1
GLOVE BIOGEL M STRL SZ7.5 (GLOVE) ×3 IMPLANT
GLOVE BIOGEL PI IND STRL 6.5 (GLOVE) IMPLANT
GLOVE BIOGEL PI IND STRL 7.0 (GLOVE) IMPLANT
GLOVE BIOGEL PI IND STRL 8 (GLOVE) ×1 IMPLANT
GLOVE BIOGEL PI INDICATOR 6.5 (GLOVE) ×2
GLOVE BIOGEL PI INDICATOR 7.0 (GLOVE) ×2
GLOVE BIOGEL PI INDICATOR 8 (GLOVE) ×2
GLOVE ECLIPSE 6.5 STRL STRAW (GLOVE) ×2 IMPLANT
GOWN STRL REUS W/ TWL LRG LVL3 (GOWN DISPOSABLE) ×2 IMPLANT
GOWN STRL REUS W/TWL LRG LVL3 (GOWN DISPOSABLE) ×6
NDL HYPO 30GX1 BEV (NEEDLE) IMPLANT
NDL PRECISIONGLIDE 27X1.5 (NEEDLE) ×1 IMPLANT
NEEDLE HYPO 30GX1 BEV (NEEDLE) IMPLANT
NEEDLE PRECISIONGLIDE 27X1.5 (NEEDLE) ×3 IMPLANT
NS IRRIG 1000ML POUR BTL (IV SOLUTION) ×2 IMPLANT
PACK BASIN DAY SURGERY FS (CUSTOM PROCEDURE TRAY) ×3 IMPLANT
PAD DRESSING TELFA 3X8 NADH (GAUZE/BANDAGES/DRESSINGS) IMPLANT
PENCIL SMOKE EVACUATOR (MISCELLANEOUS) ×3 IMPLANT
SHEET MEDIUM DRAPE 40X70 STRL (DRAPES) ×2 IMPLANT
SLEEVE SCD COMPRESS KNEE MED (MISCELLANEOUS) IMPLANT
SPONGE LAP 18X18 RF (DISPOSABLE) ×2 IMPLANT
STAPLER VISISTAT 35W (STAPLE) ×1 IMPLANT
STOCKINETTE TUBULAR 6 INCH (GAUZE/BANDAGES/DRESSINGS) ×2 IMPLANT
STRIP CLOSURE SKIN 1/2X4 (GAUZE/BANDAGES/DRESSINGS) IMPLANT
SUCTION FRAZIER HANDLE 10FR (MISCELLANEOUS) ×3
SUCTION TUBE FRAZIER 10FR DISP (MISCELLANEOUS) IMPLANT
SUT ETHILON 4 0 PS 2 18 (SUTURE) IMPLANT
SUT MNCRL AB 3-0 PS2 27 (SUTURE) ×4 IMPLANT
SUT MNCRL AB 4-0 PS2 18 (SUTURE) IMPLANT
SUT MON AB 5-0 P3 18 (SUTURE) IMPLANT
SUT PDS 3-0 CT2 (SUTURE)
SUT PDS II 3-0 CT2 27 ABS (SUTURE) IMPLANT
SUT PLAIN 5 0 P 3 18 (SUTURE) IMPLANT
SUT PROLENE 5 0 P 3 (SUTURE) IMPLANT
SUT VICRYL 4-0 PS2 18IN ABS (SUTURE) IMPLANT
SWAB COLLECTION DEVICE MRSA (MISCELLANEOUS) IMPLANT
SWAB CULTURE ESWAB REG 1ML (MISCELLANEOUS) IMPLANT
SYR BULB EAR ULCER 3OZ GRN STR (SYRINGE) IMPLANT
SYR CONTROL 10ML LL (SYRINGE) ×3 IMPLANT
TOWEL GREEN STERILE FF (TOWEL DISPOSABLE) ×3 IMPLANT
TRAY DSU PREP LF (CUSTOM PROCEDURE TRAY) IMPLANT
TUBE CONNECTING 20'X1/4 (TUBING) ×1
TUBE CONNECTING 20X1/4 (TUBING) ×1 IMPLANT

## 2019-10-14 NOTE — Anesthesia Preprocedure Evaluation (Addendum)
Anesthesia Evaluation  Patient identified by MRN, date of birth, ID band Patient awake    Reviewed: Allergy & Precautions, NPO status , Patient's Chart, lab work & pertinent test results  Airway Mallampati: I  TM Distance: >3 FB Neck ROM: Full    Dental  (+) Teeth Intact, Dental Advisory Given   Pulmonary    breath sounds clear to auscultation       Cardiovascular  Rhythm:Regular Rate:Normal     Neuro/Psych PSYCHIATRIC DISORDERS Anxiety Depression    GI/Hepatic   Endo/Other    Renal/GU      Musculoskeletal   Abdominal Normal abdominal exam  (+)   Peds  Hematology   Anesthesia Other Findings   Reproductive/Obstetrics                            Anesthesia Physical Anesthesia Plan  ASA: II  Anesthesia Plan: General   Post-op Pain Management:    Induction: Intravenous  PONV Risk Score and Plan: 3 and Ondansetron, Dexamethasone and Midazolam  Airway Management Planned: LMA and Oral ETT  Additional Equipment: None  Intra-op Plan:   Post-operative Plan: Extubation in OR  Informed Consent: I have reviewed the patients History and Physical, chart, labs and discussed the procedure including the risks, benefits and alternatives for the proposed anesthesia with the patient or authorized representative who has indicated his/her understanding and acceptance.     Dental advisory given  Plan Discussed with: CRNA  Anesthesia Plan Comments:        Anesthesia Quick Evaluation

## 2019-10-14 NOTE — Anesthesia Procedure Notes (Addendum)
Procedure Name: Intubation Date/Time: 10/14/2019 10:57 AM Performed by: Willa Frater, CRNA Pre-anesthesia Checklist: Patient identified, Emergency Drugs available, Suction available and Patient being monitored Patient Re-evaluated:Patient Re-evaluated prior to induction Oxygen Delivery Method: Circle system utilized Preoxygenation: Pre-oxygenation with 100% oxygen Induction Type: IV induction Ventilation: Mask ventilation without difficulty Laryngoscope Size: Mac and 3 Grade View: Grade II Tube type: Oral Tube size: 7.0 mm Number of attempts: 1 Airway Equipment and Method: Stylet and Oral airway Placement Confirmation: ETT inserted through vocal cords under direct vision,  positive ETCO2 and breath sounds checked- equal and bilateral Secured at: 22 cm Tube secured with: Tape Dental Injury: Teeth and Oropharynx as per pre-operative assessment

## 2019-10-14 NOTE — Discharge Instructions (Signed)
Activity: As tolerated, but avoid strenuous activity until follow up visit.  Diet: Regular  Wound Care: Keep dressing clean & dry for 1 day.  After that you can shower normally.  Redress the wound as needed for comfort.  Special Instructions:  Call our office if any unusual problems occur such as pain, excessive bleeding, unrelieved nausea/vomiting, fever &/or chills.  Follow-up appointment: Scheduled for next week.   Post Anesthesia Home Care Instructions  Activity: Get plenty of rest for the remainder of the day. A responsible individual must stay with you for 24 hours following the procedure.  For the next 24 hours, DO NOT: -Drive a car -Operate machinery -Drink alcoholic beverages -Take any medication unless instructed by your physician -Make any legal decisions or sign important papers.  Meals: Start with liquid foods such as gelatin or soup. Progress to regular foods as tolerated. Avoid greasy, spicy, heavy foods. If nausea and/or vomiting occur, drink only clear liquids until the nausea and/or vomiting subsides. Call your physician if vomiting continues.  Special Instructions/Symptoms: Your throat may feel dry or sore from the anesthesia or the breathing tube placed in your throat during surgery. If this causes discomfort, gargle with warm salt water. The discomfort should disappear within 24 hours.  If you had a scopolamine patch placed behind your ear for the management of post- operative nausea and/or vomiting:  1. The medication in the patch is effective for 72 hours, after which it should be removed.  Wrap patch in a tissue and discard in the trash. Wash hands thoroughly with soap and water. 2. You may remove the patch earlier than 72 hours if you experience unpleasant side effects which may include dry mouth, dizziness or visual disturbances. 3. Avoid touching the patch. Wash your hands with soap and water after contact with the patch.      

## 2019-10-14 NOTE — Interval H&P Note (Signed)
Patient seen and examined. Risks and benefits discussed. Proceed with surgery today.

## 2019-10-14 NOTE — Anesthesia Postprocedure Evaluation (Signed)
Anesthesia Post Note  Patient: Jonathan [name]  Procedure(s) Performed: Excision of posterior scalp mass (N/A Scalp)     Patient location during evaluation: PACU Anesthesia Type: General Level of consciousness: awake and alert Pain management: pain level controlled Vital Signs Assessment: post-procedure vital signs reviewed and stable Respiratory status: spontaneous breathing, nonlabored ventilation, respiratory function stable and patient connected to nasal cannula oxygen Cardiovascular status: blood pressure returned to baseline and stable Postop Assessment: no apparent nausea or vomiting Anesthetic complications: no   No complications documented.  Last Vitals:  Vitals:   10/14/19 1220 10/14/19 1240  BP: (!) 105/57 (!) 107/57  Pulse: 75 70  Resp: 16 18  Temp:  36.6 C  SpO2: 99% 100%    Last Pain:  Vitals:   10/14/19 1240  TempSrc:   PainSc: 0-No pain                 Effie Berkshire

## 2019-10-14 NOTE — Transfer of Care (Signed)
Immediate Anesthesia Transfer of Care Note  Patient: Jonathan [name]  Procedure(s) Performed: Excision of posterior scalp mass (N/A Scalp)  Patient Location: PACU  Anesthesia Type:General  Level of Consciousness: awake, alert , oriented, drowsy and patient cooperative  Airway & Oxygen Therapy: Patient Spontanous Breathing and Patient connected to face mask oxygen  Post-op Assessment: Report given to RN and Post -op Vital signs reviewed and stable  Post vital signs: Reviewed and stable  Last Vitals:  Vitals Value Taken Time  BP    Temp    Pulse    Resp    SpO2      Last Pain:  Vitals:   10/14/19 0851  TempSrc: Oral  PainSc: 0-No pain      Patients Stated Pain Goal: 5 (50/01/64 2903)  Complications: No complications documented.

## 2019-10-14 NOTE — Brief Op Note (Signed)
10/14/2019  11:54 AM  PATIENT:  Linna Hoff  49 y.o. male  PRE-OPERATIVE DIAGNOSIS:  mass of scalp  POST-OPERATIVE DIAGNOSIS:  mass of scalp  PROCEDURE:  Procedure(s) with comments: Excision of posterior scalp mass (N/A) - 45 min  SURGEON:  Surgeon(s) and Role:    * Nikia Levels, Steffanie Dunn, MD - Primary  PHYSICIAN ASSISTANT: None  ASSISTANTS: [place], RNFA   ANESTHESIA:   general  EBL:  3 mL   BLOOD ADMINISTERED:none  DRAINS: none   LOCAL MEDICATIONS USED:  LIDOCAINE   SPECIMEN:  Source of Specimen:  posterior scalp mass  DISPOSITION OF SPECIMEN:  PATHOLOGY  COUNTS:  YES  TOURNIQUET:  * No tourniquets in log *  DICTATION: .Dragon Dictation  PLAN OF CARE: Discharge to home after PACU  PATIENT DISPOSITION:  PACU - hemodynamically stable.   Delay start of Pharmacological VTE agent (>24hrs) due to surgical blood loss or risk of bleeding: not applicable

## 2019-10-14 NOTE — Op Note (Signed)
Operative Note   DATE OF OPERATION: 10/14/2019  SURGICAL DEPARTMENT: Plastic Surgery  PREOPERATIVE DIAGNOSES: Posterior scalp mass  POSTOPERATIVE DIAGNOSES:  same  PROCEDURE: 1.  Excision of subcutaneous posterior scalp mass totaling 6 cm in size 2.  Complex closure posterior scalp totaling 6 cm in length  SURGEON: Talmadge Coventry, MD  ASSISTANT: [place], RNFA The advanced practice practitioner (APP) assisted throughout the case.  The APP was essential in retraction and counter traction when needed to make the case progress smoothly.  This retraction and assistance made it possible to see the tissue plans for the procedure.  The assistance was needed for blood control, tissue re-approximation and assisted with closure of the incision site.  ANESTHESIA:  General.   COMPLICATIONS: None.   INDICATIONS FOR PROCEDURE:  The patient, Jonathan [name] is a 49 y.o. male born on [birth year], is here for treatment of symptomatic posterior scalp mass MRN: [id number]  CONSENT:  Informed consent was obtained directly from the patient. Risks, benefits and alternatives were fully discussed. Specific risks including but not limited to bleeding, infection, hematoma, seroma, scarring, pain, contracture, asymmetry, wound healing problems, and need for further surgery were all discussed. The patient did have an ample opportunity to have questions answered to satisfaction.   DESCRIPTION OF PROCEDURE:  The patient was taken to the operating room. SCDs were placed and Ancef antibiotics were given.  General anesthesia was administered.  The patient's operative site was prepped and draped in a sterile fashion. A time out was performed and all information was confirmed to be correct.  I started by infiltrating local anesthesia.  An elliptical incision was then made with a 15 blade.  I dissected down to the mass which was located in the subcutaneous space.  This appeared to be a fatty mass consistent with a  lipoma.  Circumferential dissection was then performed and the mass was elevated off the musculature with cautery.  Meticulous hemostasis was then performed.  The surrounding skin was then undermined and advanced and closed in layers with interrupted buried 3-0 Monocryl sutures in a running 3-0 Monocryl for the skin.  Compressive dressing was then applied.  The patient tolerated the procedure well.  There were no complications. The patient was allowed to wake from anesthesia, extubated and taken to the recovery room in satisfactory condition.

## 2019-10-17 ENCOUNTER — Encounter (HOSPITAL_BASED_OUTPATIENT_CLINIC_OR_DEPARTMENT_OTHER): Payer: Self-pay | Admitting: Plastic Surgery

## 2019-10-17 ENCOUNTER — Encounter: Payer: Self-pay | Admitting: Plastic Surgery

## 2019-10-17 LAB — SURGICAL PATHOLOGY

## 2019-10-20 ENCOUNTER — Other Ambulatory Visit: Payer: Self-pay

## 2019-10-20 ENCOUNTER — Encounter: Payer: Self-pay | Admitting: Plastic Surgery

## 2019-10-20 ENCOUNTER — Ambulatory Visit (INDEPENDENT_AMBULATORY_CARE_PROVIDER_SITE_OTHER): Payer: BC Managed Care – PPO | Admitting: Plastic Surgery

## 2019-10-20 VITALS — BP 96/66 | HR 65 | Temp 98.4°F

## 2019-10-20 DIAGNOSIS — D179 Benign lipomatous neoplasm, unspecified: Secondary | ICD-10-CM

## 2019-10-20 NOTE — Progress Notes (Signed)
Patient presents postop from lipoma excision of the posterior scalp.  He feels like he is doing well.  He has noticed some numbness extending up inferior to the incision and a feeling of fluid in the surgical site.  On examination his skin looks to be healing fine.  There may be a very small seroma probably only a couple cc of fluid.  The pathology was reviewed and skin is consistent with a benign lipoma.  We discussed things to look for and I offered to see him again we will plan to follow-up on an as-needed basis this at this point.  All of his questions were answered.

## 2021-09-26 ENCOUNTER — Ambulatory Visit (INDEPENDENT_AMBULATORY_CARE_PROVIDER_SITE_OTHER): Payer: BC Managed Care – PPO | Admitting: Clinical

## 2021-09-26 DIAGNOSIS — F341 Dysthymic disorder: Secondary | ICD-10-CM | POA: Diagnosis not present

## 2021-09-26 DIAGNOSIS — F411 Generalized anxiety disorder: Secondary | ICD-10-CM

## 2021-09-26 NOTE — Progress Notes (Signed)
Time: 1:03pm-1:59pm CPT Code: 53664Q-03 Diagnosis Code: F41.1, F34.1  Stewart was seen remotely using secure video conferencing. He was in his office in [place] and the therapist was in her office at the time of the appointment. He is seeking individual therapy to manage long-standing symptoms of dysthymia and anxiety.   Intake Presenting Problem Tavarious shared that he has been in therapy since 1983-04-16. He has been working with Dr. Mel Almond for the past 15 years. Recently, he has been working to manage anxiety, perfectionism, grief over the loss of his parents, and issues in relationships. 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 His most recent episode of catatonic depression was in 2013-2016. 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 He and his wife moved to [place] in 2001/04/15. He has been teaching at [employer] since 2001-04-15, and his wife obtained a doctorate in [place]. His wife teaches Art therapist and information studies at [employer]. Symptoms Difficulty sleeping, perfectionism, difficulty coping when situations do not meet expectations, tendency toward cognitive rigidity, recurrent major depression including depressed mood that have occurred periodically since Tayo's early childhood. These have sometimes included passive suicidal ideation which on one occasion included a plan while Anden was in graduate school. His plan at the time was to overdose on sleeping pills. He was able to call suicide prevention and did not act on these thoughts. He described the potential impact on his family as a major barrier to acting on suicidal ideation. Family of Origin  Rhodes's mother died in 2014-04-16 at the age of 47. He described her mental health while alive as "horrendous," and shared suspicion that she may have experienced childhood abuse that resulted in her being sent away to a boarding school. Raedyn's maternal uncle was schizophrenic and institutionalized for  much of his life. Luvenia Heller and his brother obtained power of attorney toward the end of her life. Toward the end of her life, she became convinced that a variety of inanimate objects were sending her message. Gearald has a brother who is three years younger. Greysin's father passed away in 2019-04-16. He described a good relationship with his brother and nephews.   No needs/concerns related to ethnicity reported when asked: Ethelbert reported that three of his four grandparents were [place] immigrants. Much of his upbringing was focused on [place] American culture. He described himself as "recovering" from the [employer]. He has taken Buddhist vows.   Medical Issues Kienan sees Dr. Pearson Grippe for medication management. He is prescribed buproprion, citalopram. Sleep aids as needed. He described himself as healthy overall, with minimal medical issues.  Leisure Activities/Daily Functioning  Atthew enjoys crossword puzzles, reading, movies, cooking, walking outside, listening to music. He has not spent as much time engaged in his leisure activities since the semester started at [employer].  Substance use Juniel reported that he does not drink alcohol during the semester, but occasionally has a glass of wine or beer over the summer. He does not smoke.  Legal Status  No Legal Problems  Other  General Behavior: cooperative  Attire: appropriate  Gait: normal  Motor Activity: normal  Stream of Thought - Productivity: spontaneous  Stream of thought - Progression: normal  Stream of thought - Language: normal  Emotional tone and reactions - Mood: normal  Emotional tone and reactions - Affect: appropriate  Mental trend/Content of thoughts - Perception: normal  Mental trend/Content of thoughts - Orientation: normal  Mental trend/Content of thoughts -  Memory: normal  Mental trend/Content of thoughts - General knowledge: consistent with education  Insight: good  Judgment: good  Intelligence: average  Diagnostic Summary   Dysthmia, Generalized Anxiety Disorder   Treatment Plan Client Abilities/Strengths  Emmanuel is open to feedback in a therapeutic setting and has found constructive criticism helpful in the past. He has been engaged and motivated in therapy for several years.   Client Treatment Preferences:  He is open to virtual and in-person appointments.  Client Statement of Needs  Esmond is seeking therapy for emotional support and stress reduction in his relationship and day-to-day life.  Treatment Level   Koki typically benefits from therapy weekly to every two months, depending on the state of his mental health. Symptoms  Perfectionistic tendencies, occasional depressive episodes, anxiety related to a range of situations Problems Addressed  Goals 1. Kreg would like to develop strategies and gain support in order to prevent severe depressive episodes.  Objective  Target Date: 09/27/2022 Frequency: 2 weeks  Progress: Initial  Modality: Individual therapy  Related Interventions Aniken will be provided opportunities to discuss his experiences in session. Therapist will use CBT based strategies to help Mikell identify and disengage from maladaptive thoughts and behaviors. Therapist will incorporate behavior activation strategies as appropriate. Therapist will provide emotion regulation strategies, including meditation, mindfulness, and self-care, as appropriate.  Therapist will provide referrals for additional resources as appropriate.  Objective  Target Date:  Frequency:   Progress: 0 Modality: individual  Related Interventions Diagnosis Axis none  Dysthmia, generalized anxiety disorder   Axis none    Conditions For Discharge Achievement of treatment goals and objectives               Myrtie Cruise, PhD

## 2021-10-10 ENCOUNTER — Ambulatory Visit (INDEPENDENT_AMBULATORY_CARE_PROVIDER_SITE_OTHER): Payer: BC Managed Care – PPO | Admitting: Clinical

## 2021-10-10 DIAGNOSIS — F411 Generalized anxiety disorder: Secondary | ICD-10-CM

## 2021-10-10 DIAGNOSIS — F341 Dysthymic disorder: Secondary | ICD-10-CM | POA: Diagnosis not present

## 2021-10-10 NOTE — Progress Notes (Signed)
Time: 1:03pm-1:59pm CPT Code: [id number] Diagnosis Code: F41.1, F34.1  Jonathan [name] was seen in person for individual therapy. Session focused on gathering more information related to his presenting problem, as well as issues tackled in prior therapy. He queried as to whether he should pursue an evaluation to rule out ASD, and therapist engaged him in a discussion of the evaluation process and pros and cons of pursuing an evaluation. He also shared about his tendency toward imposter syndrome and rule-based thinking in social situations. He is scheduled to be seen again in one week.   Treatment Plan Client Abilities/Strengths  Jonathan [name] is open to feedback in a therapeutic setting and has found constructive criticism helpful in the past. He has been engaged and motivated in therapy for several years.   Client Treatment Preferences:  He is open to virtual and in-person appointments.  Client Statement of Needs  Jonathan [name] is seeking therapy for emotional support and stress reduction in his relationship and day-to-day life.  Treatment Level   Jonathan [name] typically benefits from therapy weekly to every two months, depending on the state of his mental health. Symptoms  Perfectionistic tendencies, occasional depressive episodes, anxiety related to a range of situations Problems Addressed  Goals 1. Jonathan [name] would like to develop strategies and gain support in order to prevent severe depressive episodes.  Objective  Target Date: 09/27/2022 Frequency: 2 weeks  Progress: Initial  Modality: Individual therapy  Related Interventions Jonathan [name] will be provided opportunities to discuss his experiences in session. Therapist will use CBT based strategies to help Jonathan [name] identify and disengage from maladaptive thoughts and behaviors. Therapist will incorporate behavior activation strategies as appropriate. Therapist will provide emotion regulation strategies, including meditation, mindfulness, and self-care, as appropriate.  Therapist will provide  referrals for additional resources as appropriate.  Objective  Target Date:  Frequency:   Progress: 0 Modality: individual  Related Interventions Diagnosis Axis none  Dysthmia, generalized anxiety disorder   Axis none    Conditions For Discharge Achievement of treatment goals and objectives               Myrtie Cruise, PhD               Myrtie Cruise, PhD

## 2021-10-17 ENCOUNTER — Ambulatory Visit: Payer: BC Managed Care – PPO | Admitting: Clinical

## 2021-10-17 DIAGNOSIS — F341 Dysthymic disorder: Secondary | ICD-10-CM | POA: Diagnosis not present

## 2021-10-17 DIAGNOSIS — F411 Generalized anxiety disorder: Secondary | ICD-10-CM

## 2021-10-17 NOTE — Progress Notes (Signed)
Time: 1:03pm-1:59pm CPT Code: [id number] Diagnosis Code: F41.1, F34.1  Jonathan [name] was seen in person for individual therapy. 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 Therapist processed these questions with him, providing psychoeducation where appropriate (therapist suggested exploring love languages with his wife and considering couples therapy). He will call to set up further appointments.     Treatment Plan Client Abilities/Strengths  Jonathan [name] is open to feedback in a therapeutic setting and has found constructive criticism helpful in the past. He has been engaged and motivated in therapy for several years.   Client Treatment Preferences:  He is open to virtual and in-person appointments.  Client Statement of Needs  Jonathan [name] is seeking therapy for emotional support and stress reduction in his relationship and day-to-day life.  Treatment Level   Jonathan [name] typically benefits from therapy weekly to every two months, depending on the state of his mental health. Symptoms  Perfectionistic tendencies, occasional depressive episodes, anxiety related to a range of situations Problems Addressed  Goals 1. Jonathan [name] would like to develop strategies and gain support in order to prevent severe depressive episodes.  Objective  Target Date: 09/27/2022 Frequency: 2 weeks  Progress: Initial  Modality: Individual therapy  Related Interventions Jonathan [name] will be provided opportunities to discuss his experiences in session. Therapist will use CBT based strategies to help Jonathan [name] identify and disengage from maladaptive thoughts and behaviors. Therapist will incorporate behavior activation strategies as appropriate. Therapist will provide emotion regulation strategies, including meditation, mindfulness, and self-care, as appropriate.   Therapist will provide referrals for additional resources as appropriate.  Objective  Target Date:  Frequency:   Progress: 0 Modality: individual  Related Interventions Diagnosis Axis none  Dysthmia, generalized anxiety disorder   Axis none    Conditions For Discharge Achievement of treatment goals and objectives              Jonathan Cruise, PhD               Jonathan Cruise, PhD

## 2021-12-27 ENCOUNTER — Ambulatory Visit (INDEPENDENT_AMBULATORY_CARE_PROVIDER_SITE_OTHER): Payer: BC Managed Care – PPO | Admitting: Clinical

## 2021-12-27 DIAGNOSIS — F341 Dysthymic disorder: Secondary | ICD-10-CM

## 2021-12-27 DIAGNOSIS — F411 Generalized anxiety disorder: Secondary | ICD-10-CM

## 2021-12-27 NOTE — Progress Notes (Signed)
Time: 10:03 am-10:59 am CPT Code: [id number] Diagnosis Code: F41.1, F34.1  Jonathan [name] was seen in person for individual therapy. He reported that he had moved to his new home and had a final session with Dr. Drema Dallas. Session focused on his desire to return to regular meditation and exercise. He also expressed a desire to begin tracking his mood, and therapist engaged him in a discussion of how he might resume these habits and begin mood tracking. For homework, he will implement strategies discussed in session. He is scheduled to be seen again in one week.    Treatment Plan Client Abilities/Strengths  My is open to feedback in a therapeutic setting and has found constructive criticism helpful in the past. He has been engaged and motivated in therapy for several years.   Client Treatment Preferences:  He is open to virtual and in-person appointments.  Client Statement of Needs  Jonathan [name] is seeking therapy for emotional support and stress reduction in his relationship and day-to-day life.  Treatment Level   Jonathan [name] typically benefits from therapy weekly to every two months, depending on the state of his mental health. Symptoms  Perfectionistic tendencies, occasional depressive episodes, anxiety related to a range of situations Problems Addressed  Goals 1. Jonathan [name] would like to develop strategies and gain support in order to prevent severe depressive episodes.  Objective  Target Date: 09/27/2022 Frequency: 2 weeks  Progress: Initial  Modality: Individual therapy  Related Interventions Jonathan [name] will be provided opportunities to discuss his experiences in session. Therapist will use CBT based strategies to help Jonathan [name] identify and disengage from maladaptive thoughts and behaviors. Therapist will incorporate behavior activation strategies as appropriate. Therapist will provide emotion regulation strategies, including meditation, mindfulness, and self-care, as appropriate.  Therapist will provide referrals for additional  resources as appropriate.  Objective  Target Date:  Frequency:   Progress: 0 Modality: individual  Related Interventions Diagnosis Axis none  Dysthmia, generalized anxiety disorder   Axis none    Conditions For Discharge Achievement of treatment goals and objectives        Myrtie Cruise, PhD               Myrtie Cruise, PhD

## 2022-01-21 ENCOUNTER — Ambulatory Visit: Payer: BC Managed Care – PPO | Admitting: Clinical

## 2022-01-21 DIAGNOSIS — F411 Generalized anxiety disorder: Secondary | ICD-10-CM

## 2022-01-21 DIAGNOSIS — F341 Dysthymic disorder: Secondary | ICD-10-CM | POA: Diagnosis not present

## 2022-01-21 NOTE — Progress Notes (Signed)
Time: 9:03 am-9:58 am CPT Code: [id number] Diagnosis Code: F41.1, F34.1  Jonathan [name] was seen in person for individual therapy. He reported that he had completed his homework of re-incorporating exercise and meditation into his daily routine, and had noticed a slight improvement in mood and reduction in reactivity. Session focused on exploring his hestation to rely on "external factors," (such as having submitted a manuscript and gone to help a blind student clean his home). Therapist explored this with him, encouraging him to water his seeds of happiness. For homework, he will explore how to do so, such as through gratitude journaling and continuing to exercise and meditate. He is scheduled to be seen again in two weeks.    Treatment Plan Client Abilities/Strengths  Jonathan [name] is open to feedback in a therapeutic setting and has found constructive criticism helpful in the past. He has been engaged and motivated in therapy for several years.   Client Treatment Preferences:  He is open to virtual and in-person appointments.  Client Statement of Needs  Jonathan [name] is seeking therapy for emotional support and stress reduction in his relationship and day-to-day life.  Treatment Level   Jonathan [name] typically benefits from therapy weekly to every two months, depending on the state of his mental health. Symptoms  Perfectionistic tendencies, occasional depressive episodes, anxiety related to a range of situations Problems Addressed  Goals 1. Jonathan [name] would like to develop strategies and gain support in order to prevent severe depressive episodes.  Objective  Target Date: 09/27/2022 Frequency: 2 weeks  Progress: Initial  Modality: Individual therapy  Related Interventions Jonathan [name] will be provided opportunities to discuss his experiences in session. Therapist will use CBT based strategies to help Jonathan [name] identify and disengage from maladaptive thoughts and behaviors. Therapist will incorporate behavior activation strategies as  appropriate. Therapist will provide emotion regulation strategies, including meditation, mindfulness, and self-care, as appropriate.  Therapist will provide referrals for additional resources as appropriate.  Objective  Target Date: 09/27/2022 Frequency:   Progress: 0 Modality: individual  Related Interventions Diagnosis Axis none  Dysthmia, generalized anxiety disorder   Axis none    Conditions For Discharge Achievement of treatment goals and objectives        Myrtie Cruise, PhD               Myrtie Cruise, PhD

## 2022-02-04 ENCOUNTER — Ambulatory Visit: Payer: BC Managed Care – PPO | Admitting: Clinical

## 2022-02-04 DIAGNOSIS — F341 Dysthymic disorder: Secondary | ICD-10-CM | POA: Diagnosis not present

## 2022-02-04 DIAGNOSIS — F411 Generalized anxiety disorder: Secondary | ICD-10-CM | POA: Diagnosis not present

## 2022-02-04 NOTE — Progress Notes (Signed)
Time: 9:03 am-9:58 am CPT Code: [id number] Diagnosis Code: F41.1, F34.1  Jonathan [name] was seen in person for individual therapy. He reported that he continues to maintain a routine of exercise and meditation. Session focused on managing work-life balance, as he is anticipating a busy semester. Therapist suggested several strategies to help him challenge the thought that he does not have enough time, including maintaining his self-care practices, pausing and sitting with the urge to rush to do work, finding ways to create a pleasant work environment, and mindfulness reminders that he does have enough time. He agreed to try these strategies for homework, and is scheduled to be seen again in two weeks.  Treatment Plan Client Abilities/Strengths  Jonathan [name] is open to feedback in a therapeutic setting and has found constructive criticism helpful in the past. He has been engaged and motivated in therapy for several years.   Client Treatment Preferences:  He is open to virtual and in-person appointments.  Client Statement of Needs  Jonathan [name] is seeking therapy for emotional support and stress reduction in his relationship and day-to-day life.  Treatment Level   Jonathan [name] typically benefits from therapy weekly to every two months, depending on the state of his mental health. Symptoms  Perfectionistic tendencies, occasional depressive episodes, anxiety related to a range of situations Problems Addressed  Goals 1. Jonathan [name] would like to develop strategies and gain support in order to prevent severe depressive episodes.  Objective  Target Date: 09/27/2022 Frequency: 2 weeks  Progress: Initial  Modality: Individual therapy  Related Interventions Jonathan [name] will be provided opportunities to discuss his experiences in session. Therapist will use CBT based strategies to help Jonathan [name] identify and disengage from maladaptive thoughts and behaviors. Therapist will incorporate behavior activation strategies as appropriate. Therapist will provide emotion  regulation strategies, including meditation, mindfulness, and self-care, as appropriate.  Therapist will provide referrals for additional resources as appropriate.  Objective  Target Date: 09/27/2022 Frequency:   Progress: 0 Modality: individual  Related Interventions Diagnosis Axis none  Dysthmia, generalized anxiety disorder   Axis none    Conditions For Discharge Achievement of treatment goals and objectives      Jonathan Cruise, PhD

## 2022-02-18 ENCOUNTER — Ambulatory Visit (INDEPENDENT_AMBULATORY_CARE_PROVIDER_SITE_OTHER): Payer: BC Managed Care – PPO | Admitting: Clinical

## 2022-02-18 DIAGNOSIS — F411 Generalized anxiety disorder: Secondary | ICD-10-CM

## 2022-02-18 DIAGNOSIS — F341 Dysthymic disorder: Secondary | ICD-10-CM

## 2022-02-18 NOTE — Progress Notes (Signed)
Time: 9:03 am-9:58 am CPT Code: 95638V Diagnosis Code: F41.1, F34.1  Jonathan [name] was seen in person for individual therapy. He reported that he continues to maintain a routine of exercise and meditation. He shared that a long-friend had committed suicide shortly after his last appointment, and he planned to drive to [place] to attend a memorial the coming weekend. Jonathan [name] shared that the experience had reinforced boundaries against suicidal ideation, as he has seen the impact on his friend's family. Session focused on processing this and preparing for his upcoming trip. Therapist introduced the idea of values-based decision making, and worked with him to consider how that might apply to his trip to [place]. He agreed to continue using discussed strategies for homework, and is scheduled to be seen again in two weeks.  Treatment Plan Client Abilities/Strengths  Jonathan [name] is open to feedback in a therapeutic setting and has found constructive criticism helpful in the past. He has been engaged and motivated in therapy for several years.   Client Treatment Preferences:  He is open to virtual and in-person appointments.  Client Statement of Needs  Jonathan [name] is seeking therapy for emotional support and stress reduction in his relationship and day-to-day life.  Treatment Level   Jonathan [name] typically benefits from therapy weekly to every two months, depending on the state of his mental health. Symptoms  Perfectionistic tendencies, occasional depressive episodes, anxiety related to a range of situations Problems Addressed  Goals 1. Jonathan [name] would like to develop strategies and gain support in order to prevent severe depressive episodes.  Objective  Target Date: 09/27/2022 Frequency: 2 weeks  Progress: Initial  Modality: Individual therapy  Related Interventions Jonathan [name] will be provided opportunities to discuss his experiences in session. Therapist will use CBT based strategies to help Jonathan [name] identify and disengage from maladaptive  thoughts and behaviors. Therapist will incorporate behavior activation strategies as appropriate. Therapist will provide emotion regulation strategies, including meditation, mindfulness, and self-care, as appropriate.  Therapist will provide referrals for additional resources as appropriate.  Objective  Target Date: 09/27/2022 Frequency:   Progress: 0 Modality: individual  Related Interventions Diagnosis Axis none  Dysthmia, generalized anxiety disorder   Axis none    Conditions For Discharge Achievement of treatment goals and objectives      Myrtie Cruise, PhD               Myrtie Cruise, PhD

## 2022-03-04 ENCOUNTER — Ambulatory Visit: Payer: BC Managed Care – PPO | Admitting: Clinical

## 2022-03-04 DIAGNOSIS — F341 Dysthymic disorder: Secondary | ICD-10-CM | POA: Diagnosis not present

## 2022-03-04 DIAGNOSIS — F411 Generalized anxiety disorder: Secondary | ICD-10-CM

## 2022-03-04 NOTE — Progress Notes (Signed)
Time: 9:03 am-9:58 am CPT Code: [id number] Diagnosis Code: F41.1, F34.1  Jonathan [name] was seen in person for individual therapy. He reported that he continues to exercise and meditate, but would like to do both more often. He would also like to return to using mindfulness timers in his phone. Session focused on processing his trip to [place] for a friend's funeral in the context of exploring Jonathan [name]'s values. For homework, he will work on running and biking more often, as well as meditating twice daily. He is scheduled to be seen again in one month.  Treatment Plan Client Abilities/Strengths  Jonathan [name] is open to feedback in a therapeutic setting and has found constructive criticism helpful in the past. He has been engaged and motivated in therapy for several years.   Client Treatment Preferences:  He is open to virtual and in-person appointments.  Client Statement of Needs  Jonathan [name] is seeking therapy for emotional support and stress reduction in his relationship and day-to-day life.  Treatment Level   Jonathan [name] typically benefits from therapy weekly to every two months, depending on the state of his mental health. Symptoms  Perfectionistic tendencies, occasional depressive episodes, anxiety related to a range of situations Problems Addressed  Goals 1. Jonathan [name] would like to develop strategies and gain support in order to prevent severe depressive episodes.  Objective  Target Date: 09/27/2022 Frequency: 2 weeks  Progress: Initial  Modality: Individual therapy  Related Interventions Jonathan [name] will be provided opportunities to discuss his experiences in session. Therapist will use CBT based strategies to help Jonathan [name] identify and disengage from maladaptive thoughts and behaviors. Therapist will incorporate behavior activation strategies as appropriate. Therapist will provide emotion regulation strategies, including meditation, mindfulness, and self-care, as appropriate.  Therapist will provide referrals for additional resources as  appropriate.  Objective  Target Date: 09/27/2022 Frequency:   Progress: 0 Modality: individual  Related Interventions Diagnosis Axis none  Dysthmia, generalized anxiety disorder   Axis none    Conditions For Discharge Achievement of treatment goals and objectives     Myrtie Cruise, PhD               Myrtie Cruise, PhD

## 2022-03-18 ENCOUNTER — Ambulatory Visit: Payer: BC Managed Care – PPO | Admitting: Clinical

## 2022-04-01 ENCOUNTER — Ambulatory Visit (INDEPENDENT_AMBULATORY_CARE_PROVIDER_SITE_OTHER): Payer: BC Managed Care – PPO | Admitting: Clinical

## 2022-04-01 DIAGNOSIS — F411 Generalized anxiety disorder: Secondary | ICD-10-CM | POA: Diagnosis not present

## 2022-04-01 DIAGNOSIS — F341 Dysthymic disorder: Secondary | ICD-10-CM

## 2022-04-01 NOTE — Progress Notes (Signed)
Time: 9:03 am-9:58 am CPT Code: [id number] Diagnosis Code: F41.1, F34.1  Karsen was seen in person for individual therapy. He reported that he continues to exercise and meditate, but would like to do both more often. He had written down a list of cognitive distortions he experiences. He reported that these thoughts themselves do not cause him significant distress, but shape his behavior in a way he does not like. Therapist suggested using affirmations to program his brain toward thoughts he does want to have. For homework, he will come up with affirmations. He will also try to meditate in the evenings. He is scheduled to be seen again in two weeks.  Treatment Plan Client Abilities/Strengths  Mucaad is open to feedback in a therapeutic setting and has found constructive criticism helpful in the past. He has been engaged and motivated in therapy for several years.   Client Treatment Preferences:  He is open to virtual and in-person appointments.  Client Statement of Needs  Shriyans is seeking therapy for emotional support and stress reduction in his relationship and day-to-day life.  Treatment Level   Zarin typically benefits from therapy weekly to every two months, depending on the state of his mental health. Symptoms  Perfectionistic tendencies, occasional depressive episodes, anxiety related to a range of situations Problems Addressed  Goals 1. Amed would like to develop strategies and gain support in order to prevent severe depressive episodes.  Objective  Target Date: 09/27/2022 Frequency: 2 weeks  Progress: Initial  Modality: Individual therapy  Related Interventions Dart will be provided opportunities to discuss his experiences in session. Therapist will use CBT based strategies to help Javious identify and disengage from maladaptive thoughts and behaviors. Therapist will incorporate behavior activation strategies as appropriate. Therapist will provide emotion regulation strategies, including meditation,  mindfulness, and self-care, as appropriate.  Therapist will provide referrals for additional resources as appropriate.  Objective  Target Date: 09/27/2022 Frequency:   Progress: 0 Modality: individual  Related Interventions Diagnosis Axis none  Dysthmia, generalized anxiety disorder   Axis none    Conditions For Discharge Achievement of treatment goals and objectives   Myrtie Cruise, PhD               Myrtie Cruise, PhD

## 2022-04-15 ENCOUNTER — Ambulatory Visit (INDEPENDENT_AMBULATORY_CARE_PROVIDER_SITE_OTHER): Payer: BC Managed Care – PPO | Admitting: Clinical

## 2022-04-15 DIAGNOSIS — F411 Generalized anxiety disorder: Secondary | ICD-10-CM | POA: Diagnosis not present

## 2022-04-15 DIAGNOSIS — F341 Dysthymic disorder: Secondary | ICD-10-CM

## 2022-04-15 NOTE — Progress Notes (Signed)
Time: 9:03 am-9:58 am CPT Code: [id number] Diagnosis Code: F41.1, F34.1  Linnell was seen in person for individual therapy. He had further explored the idea of affirmations, and therapist engaged with him in a discussion of what it might look like to incorporate them into his routine. He has been waking up earlier than usual, and therapist suggested using this time for self care, which he expressed interest in. For homework, he will try incorporating affirmations, and continued practicing meditation and exercise. He described his mood as stable overall. He is scheduled to be seen again in two weeks.  Treatment Plan Client Abilities/Strengths  Hikaru is open to feedback in a therapeutic setting and has found constructive criticism helpful in the past. He has been engaged and motivated in therapy for several years.   Client Treatment Preferences:  He is open to virtual and in-person appointments.  Client Statement of Needs  Michaeljoseph is seeking therapy for emotional support and stress reduction in his relationship and day-to-day life.  Treatment Level   Benson typically benefits from therapy weekly to every two months, depending on the state of his mental health. Symptoms  Perfectionistic tendencies, occasional depressive episodes, anxiety related to a range of situations Problems Addressed  Goals 1. Tou would like to develop strategies and gain support in order to prevent severe depressive episodes.  Objective  Target Date: 09/27/2022 Frequency: 2 weeks  Progress: Initial  Modality: Individual therapy  Related Interventions Mohib will be provided opportunities to discuss his experiences in session. Therapist will use CBT based strategies to help Yovanni identify and disengage from maladaptive thoughts and behaviors. Therapist will incorporate behavior activation strategies as appropriate. Therapist will provide emotion regulation strategies, including meditation, mindfulness, and self-care, as appropriate.   Therapist will provide referrals for additional resources as appropriate.  Objective  Target Date: 09/27/2022 Frequency:   Progress: 0 Modality: individual  Related Interventions Diagnosis Axis none  Dysthmia, generalized anxiety disorder   Axis none    Conditions For Discharge Achievement of treatment goals and objectives    Myrtie Cruise, PhD               Myrtie Cruise, PhD

## 2022-04-29 ENCOUNTER — Ambulatory Visit: Payer: BC Managed Care – PPO | Admitting: Clinical

## 2022-05-13 ENCOUNTER — Ambulatory Visit: Payer: BC Managed Care – PPO | Admitting: Clinical

## 2022-05-13 DIAGNOSIS — F411 Generalized anxiety disorder: Secondary | ICD-10-CM | POA: Diagnosis not present

## 2022-05-13 DIAGNOSIS — F341 Dysthymic disorder: Secondary | ICD-10-CM

## 2022-05-13 NOTE — Progress Notes (Signed)
Time: 9:03 am-9:58 am CPT Code: [id number] Diagnosis Code: F41.1, F34.1  Jonathan [name] was seen in person for individual therapy. He had completed some but not all of his homework. Session focused on continuing to explore the idea of affirmations, including coming up with affirmations that he can use, and exploring the ways in which some of his current automatic thought patterns are not necessarily more accurate. He is scheduled to be seen again in two weeks.  Treatment Plan Client Abilities/Strengths  Jonathan [name] is open to feedback in a therapeutic setting and has found constructive criticism helpful in the past. He has been engaged and motivated in therapy for several years.   Client Treatment Preferences:  He is open to virtual and in-person appointments.  Client Statement of Needs  Jonathan [name] is seeking therapy for emotional support and stress reduction in his relationship and day-to-day life.  Treatment Level   Jonathan [name] typically benefits from therapy weekly to every two months, depending on the state of his mental health. Symptoms  Perfectionistic tendencies, occasional depressive episodes, anxiety related to a range of situations Problems Addressed  Goals 1. Jonathan [name] would like to develop strategies and gain support in order to prevent severe depressive episodes.  Objective  Target Date: 09/27/2022 Frequency: 2 weeks  Progress: Initial  Modality: Individual therapy  Related Interventions Jonathan [name] will be provided opportunities to discuss his experiences in session. Therapist will use CBT based strategies to help Jonathan [name] identify and disengage from maladaptive thoughts and behaviors. Therapist will incorporate behavior activation strategies as appropriate. Therapist will provide emotion regulation strategies, including meditation, mindfulness, and self-care, as appropriate.  Therapist will provide referrals for additional resources as appropriate.  Objective  Target Date: 09/27/2022 Frequency:   Progress: 0 Modality:  individual  Related Interventions Diagnosis Axis none  Dysthmia, generalized anxiety disorder   Axis none    Conditions For Discharge Achievement of treatment goals and objectives    [name], PhD               [name], PhD

## 2022-05-27 ENCOUNTER — Ambulatory Visit: Payer: BC Managed Care – PPO | Admitting: Clinical

## 2022-06-10 ENCOUNTER — Ambulatory Visit: Payer: BC Managed Care – PPO | Admitting: Clinical

## 2022-06-10 DIAGNOSIS — F341 Dysthymic disorder: Secondary | ICD-10-CM | POA: Diagnosis not present

## 2022-06-10 DIAGNOSIS — F411 Generalized anxiety disorder: Secondary | ICD-10-CM

## 2022-06-10 NOTE — Progress Notes (Signed)
Time: 9:03 am-9:58 am CPT Code: [id number] Diagnosis Code: F41.1, F34.1  Jonathan [name] was seen in person for individual therapy. Session focused on processing dynamics in his relationship following a vacation to [facility]. Therapist suggested communication and worked with him to discuss risks and benefits of various courses of action. He is scheduled to be seen again in one month, and will reach out if he needs to be seen sooner.  Treatment Plan Client Abilities/Strengths  Jonathan [name] is open to feedback in a therapeutic setting and has found constructive criticism helpful in the past. He has been engaged and motivated in therapy for several years.   Client Treatment Preferences:  He is open to virtual and in-person appointments.  Client Statement of Needs  Jonathan [name] is seeking therapy for emotional support and stress reduction in his relationship and day-to-day life.  Treatment Level   Jonathan [name] typically benefits from therapy weekly to every two months, depending on the state of his mental health. Symptoms  Perfectionistic tendencies, occasional depressive episodes, anxiety related to a range of situations Problems Addressed  Goals 1. Jonathan [name] would like to develop strategies and gain support in order to prevent severe depressive episodes.  Objective  Target Date: 09/27/2022 Frequency: 2 weeks  Progress: Initial  Modality: Individual therapy  Related Interventions Jonathan [name] will be provided opportunities to discuss his experiences in session. Therapist will use CBT based strategies to help Jonathan [name] identify and disengage from maladaptive thoughts and behaviors. Therapist will incorporate behavior activation strategies as appropriate. Therapist will provide emotion regulation strategies, including meditation, mindfulness, and self-care, as appropriate.  Therapist will provide referrals for additional resources as appropriate.  Objective  Target Date: 09/27/2022 Frequency:   Progress: 0 Modality: individual  Related  Interventions Diagnosis Axis none  Dysthmia, generalized anxiety disorder   Axis none    Conditions For Discharge Achievement of treatment goals and objectives    [name], PhD               [name], PhD

## 2022-06-24 ENCOUNTER — Ambulatory Visit: Payer: BC Managed Care – PPO | Admitting: Clinical

## 2022-07-08 ENCOUNTER — Ambulatory Visit: Payer: BC Managed Care – PPO | Admitting: Clinical

## 2022-07-08 DIAGNOSIS — F341 Dysthymic disorder: Secondary | ICD-10-CM

## 2022-07-08 DIAGNOSIS — F411 Generalized anxiety disorder: Secondary | ICD-10-CM

## 2022-07-08 NOTE — Progress Notes (Signed)
Time: 9:03 am-9:58 am CPT Code: 32440N Diagnosis Code: F41.1, F34.1  Kajuan was seen in person for individual therapy. Session focused on continuing to explore communication strategies he can use in his marriage. He is scheduled to be seen again in two weeks.  Treatment Plan Client Abilities/Strengths  Sterlin is open to feedback in a therapeutic setting and has found constructive criticism helpful in the past. He has been engaged and motivated in therapy for several years.   Client Treatment Preferences:  He is open to virtual and in-person appointments.  Client Statement of Needs  Kohan is seeking therapy for emotional support and stress reduction in his relationship and day-to-day life.  Treatment Level   Bernon typically benefits from therapy weekly to every two months, depending on the state of his mental health. Symptoms  Perfectionistic tendencies, occasional depressive episodes, anxiety related to a range of situations Problems Addressed  Goals 1. Rahshawn would like to develop strategies and gain support in order to prevent severe depressive episodes.  Objective  Target Date: 09/27/2022 Frequency: 2 weeks  Progress: Initial  Modality: Individual therapy  Related Interventions Mays will be provided opportunities to discuss his experiences in session. Therapist will use CBT based strategies to help Kirill identify and disengage from maladaptive thoughts and behaviors. Therapist will incorporate behavior activation strategies as appropriate. Therapist will provide emotion regulation strategies, including meditation, mindfulness, and self-care, as appropriate.  Therapist will provide referrals for additional resources as appropriate.  Objective  Target Date: 09/27/2022 Frequency:   Progress: 0 Modality: individual  Related Interventions Diagnosis Axis none  Dysthmia, generalized anxiety disorder   Axis none    Conditions For Discharge Achievement of treatment goals and objectives    [name], PhD               [name], PhD               [name], PhD

## 2022-07-22 ENCOUNTER — Ambulatory Visit: Payer: BC Managed Care – PPO | Admitting: Clinical

## 2022-07-22 DIAGNOSIS — F341 Dysthymic disorder: Secondary | ICD-10-CM

## 2022-07-22 DIAGNOSIS — F411 Generalized anxiety disorder: Secondary | ICD-10-CM | POA: Diagnosis not present

## 2022-07-22 NOTE — Progress Notes (Signed)
Time: 9:03 am-9:58 am CPT Code: 62952W Diagnosis Code: F41.1, F34.1  Jonathan [name] was seen in person for individual therapy. He reported stable mood since his last session, and that he had scheduled another retreat in a cabin since his first had gone well. He has been looking for activities he can enjoy to increase his overall wellbeing, and to feel less dependent on his wife for purpose and enjoyment. He had had a conversation with his wife about love languages that had gone well. For homework, he will continue meditating and resume regular exercise. He is scheduled to be seen again in two weeks.  Treatment Plan Client Abilities/Strengths  Jonathan [name] is open to feedback in a therapeutic setting and has found constructive criticism helpful in the past. He has been engaged and motivated in therapy for several years.   Client Treatment Preferences:  He is open to virtual and in-person appointments.  Client Statement of Needs  Jonathan [name] is seeking therapy for emotional support and stress reduction in his relationship and day-to-day life.  Treatment Level   Jonathan [name] typically benefits from therapy weekly to every two months, depending on the state of his mental health. Symptoms  Perfectionistic tendencies, occasional depressive episodes, anxiety related to a range of situations Problems Addressed  Goals 1. Jonathan [name] would like to develop strategies and gain support in order to prevent severe depressive episodes.  Objective  Target Date: 09/27/2022 Frequency: 2 weeks  Progress: Initial  Modality: Individual therapy  Related Interventions Jonathan [name] will be provided opportunities to discuss his experiences in session. Therapist will use CBT based strategies to help Jonathan [name] identify and disengage from maladaptive thoughts and behaviors. Therapist will incorporate behavior activation strategies as appropriate. Therapist will provide emotion regulation strategies, including meditation, mindfulness, and self-care, as appropriate.  Therapist  will provide referrals for additional resources as appropriate.  Objective  Target Date: 09/27/2022 Frequency:   Progress: 0 Modality: individual  Related Interventions Diagnosis Axis none  Dysthmia, generalized anxiety disorder   Axis none    Conditions For Discharge Achievement of treatment goals and objectives    Jonathan Noa, PhD               Jonathan Noa, PhD

## 2022-08-06 ENCOUNTER — Ambulatory Visit: Payer: BC Managed Care – PPO | Admitting: Clinical

## 2022-08-06 DIAGNOSIS — F411 Generalized anxiety disorder: Secondary | ICD-10-CM | POA: Diagnosis not present

## 2022-08-06 DIAGNOSIS — F341 Dysthymic disorder: Secondary | ICD-10-CM | POA: Diagnosis not present

## 2022-08-06 NOTE — Progress Notes (Signed)
Time: 9:03 am-9:58 am CPT Code: 30865H Diagnosis Code: F41.1, F34.1  Doron was seen in person for individual therapy. He reported stable mood since his last session. He had had several conversations with his wife about their relationship, and reported that this had gone well. He had also taken an online screener for ADHD, and presented it for the therapist's review. Although unscored, it appeared to suggest mild to moderate (at most) symptoms. Therapist suggested another screener he can take and score to discuss in session, but he opted not to. He plans to continue meditating and exercising regularly. He is scheduled to be seen again in two weeks.  Treatment Plan Client Abilities/Strengths  Johathon is open to feedback in a therapeutic setting and has found constructive criticism helpful in the past. He has been engaged and motivated in therapy for several years.   Client Treatment Preferences:  He is open to virtual and in-person appointments.  Client Statement of Needs  Kareen is seeking therapy for emotional support and stress reduction in his relationship and day-to-day life.  Treatment Level   Lennin typically benefits from therapy weekly to every two months, depending on the state of his mental health. Symptoms  Perfectionistic tendencies, occasional depressive episodes, anxiety related to a range of situations Problems Addressed  Goals 1. Paulmichael would like to develop strategies and gain support in order to prevent severe depressive episodes.  Objective  Target Date: 09/27/2022 Frequency: 2 weeks  Progress: Initial  Modality: Individual therapy  Related Interventions Holton will be provided opportunities to discuss his experiences in session. Therapist will use CBT based strategies to help Heinz identify and disengage from maladaptive thoughts and behaviors. Therapist will incorporate behavior activation strategies as appropriate. Therapist will provide emotion regulation strategies, including  meditation, mindfulness, and self-care, as appropriate.  Therapist will provide referrals for additional resources as appropriate.  Objective  Target Date: 09/27/2022 Frequency:   Progress: 0 Modality: individual  Related Interventions Diagnosis Axis none  Dysthmia, generalized anxiety disorder   Axis none    Conditions For Discharge Achievement of treatment goals and objectives    [name], PhD               [name], PhD

## 2022-08-19 ENCOUNTER — Ambulatory Visit: Payer: BC Managed Care – PPO | Admitting: Clinical

## 2022-08-19 DIAGNOSIS — F341 Dysthymic disorder: Secondary | ICD-10-CM

## 2022-08-19 DIAGNOSIS — F411 Generalized anxiety disorder: Secondary | ICD-10-CM

## 2022-08-19 NOTE — Progress Notes (Signed)
Time: 9:03 am-9:58 am CPT Code: [id number] Diagnosis Code: F41.1, F34.1  Celio was seen in person for individual therapy. He reported stable mood since his last session. Session focused on processing an experience where his wife had described his behavior as difficulty for her, and he had been unaware in the moment. Therapist suggested communication strategies. He also shared concerns about happiness, and therapist suggested exploring his values. He is scheduled to be seen again in three weeks.  Treatment Plan Client Abilities/Strengths  Tymell is open to feedback in a therapeutic setting and has found constructive criticism helpful in the past. He has been engaged and motivated in therapy for several years.   Client Treatment Preferences:  He is open to virtual and in-person appointments.  Client Statement of Needs  Kalle is seeking therapy for emotional support and stress reduction in his relationship and day-to-day life.  Treatment Level   [name] typically benefits from therapy weekly to every two months, depending on the state of his mental health. Symptoms  Perfectionistic tendencies, occasional depressive episodes, anxiety related to a range of situations Problems Addressed  Goals 1. Dontee would like to develop strategies and gain support in order to prevent severe depressive episodes.  Objective  Target Date: 09/27/2022 Frequency: 2 weeks  Progress: Initial  Modality: Individual therapy  Related Interventions Jaydien will be provided opportunities to discuss his experiences in session. Therapist will use CBT based strategies to help Eris identify and disengage from maladaptive thoughts and behaviors. Therapist will incorporate behavior activation strategies as appropriate. Therapist will provide emotion regulation strategies, including meditation, mindfulness, and self-care, as appropriate.  Therapist will provide referrals for additional resources as appropriate.  Objective  Target Date: 09/27/2022  Frequency:   Progress: 0 Modality: individual  Related Interventions Diagnosis Axis none  Dysthmia, generalized anxiety disorder   Axis none    Conditions For Discharge Achievement of treatment goals and objectives    [name], PhD               [name], PhD               [name], PhD

## 2022-09-02 ENCOUNTER — Ambulatory Visit: Payer: BC Managed Care – PPO | Admitting: Clinical

## 2022-09-10 ENCOUNTER — Ambulatory Visit: Payer: BC Managed Care – PPO | Admitting: Clinical

## 2022-09-10 DIAGNOSIS — F411 Generalized anxiety disorder: Secondary | ICD-10-CM

## 2022-09-10 DIAGNOSIS — F341 Dysthymic disorder: Secondary | ICD-10-CM

## 2022-09-10 NOTE — Progress Notes (Signed)
Time: 9:03 am-9:58 am CPT Code: 16109U Diagnosis Code: F41.1, F34.1  [name] was seen in person for individual therapy. He reported stable mood since his last session. He had completed his homework of creating a list of his values. He had also borrowed the book "Unstressable" by Angelina Sheriff from [employer]. Session focused on reviewing his values list, as well as times he had used strategies from the book in his day-to-day life. For homework, he will try organizing his values into a hierarchy. 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 He also reported exploring his relationship with himself, including an interest in trying some different clothing. He is scheduled to be seen again in two weeks.   Treatment Plan Client Abilities/Strengths  [name] is open to feedback in a therapeutic setting and has found constructive criticism helpful in the past. He has been engaged and motivated in therapy for several years.   Client Treatment Preferences:  He is open to virtual and in-person appointments.  Client Statement of Needs  [name] is seeking therapy for emotional support and stress reduction in his relationship and day-to-day life.  Treatment Level   [name] typically benefits from therapy weekly to every two months, depending on the state of his mental health. Symptoms  Perfectionistic tendencies, occasional depressive episodes, anxiety related to a range of situations Problems Addressed  Goals 1. [name] would like to develop strategies and gain support in order to prevent severe depressive episodes.  Objective  Target Date: 09/27/2022 Frequency: 2 weeks  Progress: Initial  Modality: Individual therapy  Related Interventions [name] will be provided opportunities to discuss his experiences in session. Therapist will use CBT based strategies to help [name] identify and disengage from maladaptive thoughts  and behaviors. Therapist will incorporate behavior activation strategies as appropriate. Therapist will provide emotion regulation strategies, including meditation, mindfulness, and self-care, as appropriate.  Therapist will provide referrals for additional resources as appropriate.  Objective  Target Date: 09/27/2022 Frequency:   Progress: 0 Modality: individual  Related Interventions Diagnosis Axis none  Dysthmia, generalized anxiety disorder   Axis none    Conditions For Discharge Achievement of treatment goals and objectives    Jonathan Noa, [name]               Jonathan Noa, [name]               [name], PhDTime: 9:03 am-9:58 am CPT Code: 04540J Diagnosis Code: F41.1, F34.1  [name] was seen in person for individual therapy. He reported stable mood since his last session. Session focused on processing an experience where his wife had described his behavior as difficulty for her, and he had been unaware in the moment. Therapist suggested communication strategies. He also shared concerns about happiness, and therapist suggested exploring his values. He is scheduled to be seen again in three weeks.  Treatment Plan Client Abilities/Strengths  [name] is open to feedback in a therapeutic setting and has found constructive criticism helpful in the past. He has been engaged and motivated in therapy for several years.   Client Treatment Preferences:  He is open to virtual and in-person appointments.  Client Statement of Needs  [place] [name] is seeking therapy for emotional support and stress reduction in his relationship and day-to-day life.  Treatment Level   [name] typically benefits from therapy weekly to every two months, depending on  the state of his mental health. Symptoms  Perfectionistic tendencies, occasional depressive episodes, anxiety related to a range of situations Problems Addressed  Goals 1. [name] would like to develop strategies and gain support  in order to prevent severe depressive episodes.  Objective  Target Date: 09/27/2022 Frequency: 2 weeks  Progress: Initial  Modality: Individual therapy  Related Interventions [name] will be provided opportunities to discuss his experiences in session. Therapist will use CBT based strategies to help [name] identify and disengage from maladaptive thoughts and behaviors. Therapist will incorporate behavior activation strategies as appropriate. Therapist will provide emotion regulation strategies, including meditation, mindfulness, and self-care, as appropriate.  Therapist will provide referrals for additional resources as appropriate.  Objective  Target Date: 09/27/2022 Frequency:   Progress: 0 Modality: individual  Related Interventions Diagnosis Axis none  Dysthmia, generalized anxiety disorder   Axis none    Conditions For Discharge Achievement of treatment goals and objectives   Carr Shartzer L. Montina Dorrance         Jonathan Noa, [name]               Jonathan Noa, [name]

## 2022-09-16 ENCOUNTER — Ambulatory Visit: Payer: BC Managed Care – PPO | Admitting: Clinical

## 2022-09-24 ENCOUNTER — Ambulatory Visit: Payer: BC Managed Care – PPO | Admitting: Clinical

## 2022-09-24 DIAGNOSIS — F341 Dysthymic disorder: Secondary | ICD-10-CM | POA: Diagnosis not present

## 2022-09-24 DIAGNOSIS — F411 Generalized anxiety disorder: Secondary | ICD-10-CM | POA: Diagnosis not present

## 2022-09-24 NOTE — Progress Notes (Signed)
Time: 9:03 am-9:58 am CPT Code: 16109U Diagnosis Code: F41.1, F34.1  [name] was seen in person for individual therapy. He reported stable mood since his last session. He had completed his homework of assessing how the way he spends his time lines up with his values, but had struggled to rank them. He expressed a desire to incorporate more fun into his day-to-day. Therapist encouraged him to explore structured activities, such as classes, and to allow himself to schedule fun things including on open-ended days. He is scheduled to be seen again in two weeks.   Treatment Plan Client Abilities/Strengths  [name] is open to feedback in a therapeutic setting and has found constructive criticism helpful in the past. He has been engaged and motivated in therapy for several years.   Client Treatment Preferences:  He is open to virtual and in-person appointments.  Client Statement of Needs  [name] is seeking therapy for emotional support and stress reduction in his relationship and day-to-day life.  Treatment Level   [name] typically benefits from therapy weekly to every two months, depending on the state of his mental health. Symptoms  Perfectionistic tendencies, occasional depressive episodes, anxiety related to a range of situations Problems Addressed  Goals 1. [name] would like to develop strategies and gain support in order to prevent severe depressive episodes.  Objective  Target Date: 09/27/2022 Frequency: 2 weeks  Progress: Initial  Modality: Individual therapy  Related Interventions [place] will be provided opportunities to discuss his experiences in session. Therapist will use CBT based strategies to help [name] identify and disengage from maladaptive thoughts and behaviors. Therapist will incorporate behavior activation strategies as appropriate. Therapist will provide emotion regulation strategies, including meditation, mindfulness, and self-care, as appropriate.  Therapist will provide referrals for  additional resources as appropriate.  Objective  Target Date: 09/27/2022 Frequency:   Progress: 0 Modality: individual  Related Interventions Diagnosis Axis none  Dysthmia, generalized anxiety disorder   Axis none    Conditions For Discharge Achievement of treatment goals and objectives    Jonathan Noa, PhD               Jonathan Noa, PhD               [place] [name], PhDTime: 9:03 am-9:58 am CPT Code: [id number] Diagnosis Code: F41.1, F34.1  [place] [name] was seen in person for individual therapy. He reported stable mood since his last session. Session focused on processing an experience where his wife had described his behavior as difficulty for her, and he had been unaware in the moment. Therapist suggested communication strategies. He also shared concerns about happiness, and therapist suggested exploring his values. He is scheduled to be seen again in three weeks.  Treatment Plan Client Abilities/Strengths  Jt is open to feedback in a therapeutic setting and has found constructive criticism helpful in the past. He has been engaged and motivated in therapy for several years.   Client Treatment Preferences:  He is open to virtual and in-person appointments.  Client Statement of Needs  [name] is seeking therapy for emotional support and stress reduction in his relationship and day-to-day life.  Treatment Level   [name] typically benefits from therapy weekly to every two months, depending on the state of his mental health. Symptoms  Perfectionistic tendencies, occasional depressive episodes, anxiety related to a range of situations Problems Addressed  Goals 1. [name] would like to develop strategies and gain support in order to prevent severe depressive episodes.  Objective  Target Date:  09/27/2022 Frequency: 2 weeks  Progress: Initial  Modality: Individual therapy  Related Interventions [name] will be provided opportunities to discuss his experiences in  session. Therapist will use CBT based strategies to help [name] identify and disengage from maladaptive thoughts and behaviors. Therapist will incorporate behavior activation strategies as appropriate. Therapist will provide emotion regulation strategies, including meditation, mindfulness, and self-care, as appropriate.  Therapist will provide referrals for additional resources as appropriate.  Objective  Target Date: 09/27/2022 Frequency:   Progress: 0 Modality: individual  Related Interventions Diagnosis Axis none  Dysthmia, generalized anxiety disorder   Axis none    Conditions For Discharge Achievement of treatment goals and objectives   Mayci Haning L. Nelwyn Hebdon         Jonathan Noa, PhD               [place] [name], PhDTime: 9:03 am-9:58 am CPT Code: 44034V Diagnosis Code: F41.1, F34.1  [name] was seen in person for individual therapy. He reported stable mood since his last session. He had completed his homework of creating a list of his values. He had also borrowed the book "Unstressable" by Angelina Sheriff from [employer]. Session focused on reviewing his values list, as well as times he had used strategies from the book in his day-to-day life. For homework, he will try organizing his values into a hierarchy. 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 He also reported exploring his relationship with himself, including an interest in trying some different clothing. He is scheduled to be seen again in two weeks.   Treatment Plan Client Abilities/Strengths  [name] is open to feedback in a therapeutic setting and has found constructive criticism helpful in the past. He has been engaged and motivated in therapy for several years.   Client Treatment Preferences:  He is open to virtual and in-person appointments.  Client Statement of Needs  [place] [name] is seeking  therapy for emotional support and stress reduction in his relationship and day-to-day life.  Treatment Level   [place] [name] typically benefits from therapy weekly to every two months, depending on the state of his mental health. Symptoms  Perfectionistic tendencies, occasional depressive episodes, anxiety related to a range of situations Problems Addressed  Goals 1. [name] would like to develop strategies and gain support in order to prevent severe depressive episodes.  Objective  Target Date: 09/27/2022 Frequency: 2 weeks  Progress: Initial  Modality: Individual therapy  Related Interventions [name] will be provided opportunities to discuss his experiences in session. Therapist will use CBT based strategies to help [name] identify and disengage from maladaptive thoughts and behaviors. Therapist will incorporate behavior activation strategies as appropriate. Therapist will provide emotion regulation strategies, including meditation, mindfulness, and self-care, as appropriate.  Therapist will provide referrals for additional resources as appropriate.  Objective  Target Date: 09/27/2022 Frequency:   Progress: 0 Modality: individual  Related Interventions Diagnosis Axis none  Dysthmia, generalized anxiety disorder   Axis none    Conditions For Discharge Achievement of treatment goals and objectives    Jonathan Noa, PhD               Jonathan Noa, PhD               [place] [name], PhDTime: 9:03 am-9:58 am CPT Code: [id number] Diagnosis Code: F41.1, F34.1  [name] was seen in person for individual  therapy. He reported stable mood since his last session. Session focused on processing an experience where his wife had described his behavior as difficulty for her, and he had been unaware in the moment. Therapist suggested communication strategies. He also shared concerns about happiness, and therapist suggested exploring his values. He is scheduled to be seen again in three  weeks.  Treatment Plan Client Abilities/Strengths  [name] is open to feedback in a therapeutic setting and has found constructive criticism helpful in the past. He has been engaged and motivated in therapy for several years.   Client Treatment Preferences:  He is open to virtual and in-person appointments.  Client Statement of Needs  Nazzareno is seeking therapy for emotional support and stress reduction in his relationship and day-to-day life.  Treatment Level   Jahmai typically benefits from therapy weekly to every two months, depending on the state of his mental health. Symptoms  Perfectionistic tendencies, occasional depressive episodes, anxiety related to a range of situations Problems Addressed  Goals 1. Ryder would like to develop strategies and gain support in order to prevent severe depressive episodes.  Objective  Target Date: 09/27/2022 Frequency: 2 weeks  Progress: Initial  Modality: Individual therapy  Related Interventions Zohaib will be provided opportunities to discuss his experiences in session. Therapist will use CBT based strategies to help Teshaun identify and disengage from maladaptive thoughts and behaviors. Therapist will incorporate behavior activation strategies as appropriate. Therapist will provide emotion regulation strategies, including meditation, mindfulness, and self-care, as appropriate.  Therapist will provide referrals for additional resources as appropriate.  Objective  Target Date: 09/27/2022 Frequency:   Progress: 0 Modality: individual  Related Interventions Diagnosis Axis none  Dysthmia, generalized anxiety disorder   Axis none    Conditions For Discharge Achievement of treatment goals and objectives         Jonathan Noa, PhD               Jonathan Noa, PhD

## 2022-10-08 ENCOUNTER — Ambulatory Visit: Payer: BC Managed Care – PPO | Admitting: Clinical

## 2022-10-08 DIAGNOSIS — F341 Dysthymic disorder: Secondary | ICD-10-CM | POA: Diagnosis not present

## 2022-10-08 DIAGNOSIS — F411 Generalized anxiety disorder: Secondary | ICD-10-CM
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
## 2022-10-22 ENCOUNTER — Ambulatory Visit: Payer: BC Managed Care – PPO | Admitting: Clinical

## 2022-10-22 DIAGNOSIS — F341 Dysthymic disorder: Secondary | ICD-10-CM | POA: Diagnosis not present

## 2022-10-22 DIAGNOSIS — F411 Generalized anxiety disorder: Secondary | ICD-10-CM | POA: Diagnosis not present

## 2022-10-22 NOTE — Progress Notes (Addendum)
Time: 9:03 am-9:58 am CPT Code: 75643P Diagnosis Code: F41.1, F34.1  [name] [name] seen in person for individual therapy. He reported stable mood since his last session. Session focused on processing his homework from the previous session, which included writing down what he needs to forgive himself for from when hew as 11. He reflected upon traumatic events in his family during that period, and therapist offered cognitive reframes, suggesting a more nuanced view of this period. For homework, he will write down what he is proud of about himself from that time. He is scheduled to be seen again in three weeks.      Intake  Presenting Problem  Jonathan [name] that he has been in therapy since 11/07/83. He has been working with Dr. Doran Heater for the past 15 years. Recently, he has been working to manage anxiety, perfectionism, grief over the loss of his parents, and issues in relationships. 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 He and his wife moved to [place] in 11-06-2001. He has been teaching at [employer] since November 06, 2001, and his wife obtained a doctorate in clarinette. His wife teaches Engineering geologist and information studies at [employer]. Symptoms  Difficulty sleeping, perfectionism, difficulty coping when situations do not meet expectations, tendency toward cognitive rigidity, recurrent major depression including depressed mood that have occurred periodically since [place] [name]. These have sometimes included passive suicidal ideation which on one occasion included a plan while [name] in graduate school. His plan at the time [name] to overdose on sleeping pills. He [name] able to call suicide prevention and did not act on these thoughts. He described the potential impact on his family as a major barrier to acting on suicidal ideation. Family  of Origin   Jonathan [name]'s mother died in 11/07/2014 at the age of 86. He described her mental health while alive as "horrendous," and [name] suspicion that she may have experienced [name] abuse that resulted in her being sent away to a boarding school. Jonathan [name]'s maternal uncle [name] schizophrenic and institutionalized for much of his life. Jonathan [name] and his brother obtained power of attorney toward the end of her life. Toward the end of her life, she became convinced that a variety of inanimate objects were sending her message. Jonathan [name] has a brother who is three years younger. Jonathan [name] father passed away in 11/07/19. He described a good relationship with his brother and nephews.    No needs/concerns related to ethnicity reported when asked: Jonathan [name] reported that three of his four grandparents were [name] immigrants. Much of his upbringing [name] focused on [name] culture. He described himself as "recovering" from the [name]. He has taken Buddhist vows.    Medical Issues Jonathan [name] sees Dr. Phillip Heal for medication management. He is prescribed buproprion, citalopram. Sleep aids as needed. He described himself as healthy overall, with minimal medical issues.  Leisure Activities/Daily Functioning   Jonathan [name] enjoys crossword puzzles, reading, movies, cooking, walking outside, listening to music. He has not spent as much time engaged in his leisure activities since the semester started at [employer].   Substance use Jonathan [name] reported that he does not drink alcohol during the semester, but occasionally has a glass of wine or beer over the summer. He does not smoke.  Legal Status   No Legal Problems  Other   General Behavior: cooperative  Attire:  appropriate  Gait: normal  Motor Activity: normal  Stream of Thought - Productivity: spontaneous  Stream of thought - Progression: normal  Stream of thought - Language: normal  Emotional tone and reactions - Mood: normal  Emotional tone and reactions - Affect: appropriate  Mental  trend/Content of thoughts - Perception: normal  Mental trend/Content of thoughts - Orientation: normal  Mental trend/Content of thoughts - Memory: normal  Mental trend/Content of thoughts - General knowledge: consistent with education  Insight: good  Judgment: good  Intelligence: average  Diagnostic Summary   Dysthmia, Generalized Anxiety Disorder         Treatment Plan Client Abilities/Strengths  Jonathan [name] is open to feedback in a therapeutic setting and has found constructive criticism helpful in the past. He has been engaged and motivated in therapy for several years.   Client Treatment Preferences:  He is open to virtual and in-person appointments.  Client Statement of Needs  Jonathan [name] is seeking therapy for emotional support and stress reduction in his relationship and day-to-day life.  Treatment Level   Jonathan [name] typically benefits from therapy weekly to every two months, depending on the state of his mental health. Symptoms  Perfectionistic tendencies, occasional depressive episodes, anxiety related to a range of situations Problems Addressed  Goals 1. Jonathan [name] like to develop strategies and gain support in order to prevent severe depressive episodes.  Objective  Target Date: 09/27/2023 Frequency: 2 weeks  Progress: 50% Modality: Individual therapy  Related Interventions Jonathan [name] will be provided opportunities to discuss his experiences in session. Therapist will use CBT based strategies to help Jonathan [name] identify and disengage from maladaptive thoughts and behaviors. Therapist will incorporate behavior activation strategies as appropriate. Therapist will provide emotion regulation strategies, including meditation, mindfulness, and self-care, as appropriate.  Therapist will provide referrals for additional resources as appropriate.   Related Interventions Diagnosis Axis none  Dysthmia, generalized anxiety disorder   Axis none    Conditions For Discharge Achievement of treatment goals and  objectives    Jonathan Noa, PhD               Jonathan Noa, PhD                         Jonathan Noa, PhD

## 2022-10-24 NOTE — Addendum Note (Signed)
Addended by: [name] on: 10/24/2022 11:59 AM   Modules accepted: Level of Service

## 2022-10-27 ENCOUNTER — Ambulatory Visit: Payer: BC Managed Care – PPO | Admitting: Clinical

## 2022-11-10 ENCOUNTER — Ambulatory Visit: Payer: BC Managed Care – PPO | Admitting: Clinical

## 2022-11-10 DIAGNOSIS — F411 Generalized anxiety disorder: Secondary | ICD-10-CM

## 2022-11-10 DIAGNOSIS — F341 Dysthymic disorder: Secondary | ICD-10-CM | POA: Diagnosis not present

## 2022-11-10 NOTE — Progress Notes (Signed)
Time: 9:03 am-9:58 am CPT Code: 29562Z Diagnosis Code: F41.1, F34.1  Jonathan [name] was seen in person for individual therapy. He reported stable mood since his last session. Session focused on dynamics in his relationship with a student. Therapist pointed out black or white thinking, and encouraged Jonathan [name] to be gentle with himself. Therapist also suggested further unpacking dynamics with his mother. He is scheduled to be seen again in two weeks.      Intake  Presenting Problem  Jonathan [name] shared that he has been in therapy since 07-Dec-1983. He has been working with Dr. Doran [name] for the past 15 years. Recently, he has been working to manage anxiety, perfectionism, grief over the loss of his parents, and issues in relationships. 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 His most recent episode of catatonic depression was in 2013-2016. 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 He and his wife moved to [name] in 2001/12/06. 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 His wife teaches Engineering geologist and information studies at [name]. Symptoms  Difficulty sleeping, perfectionism, difficulty coping when situations do not meet expectations, tendency toward cognitive rigidity, recurrent major depression including depressed mood that have occurred periodically since Jonathan [name]'s early childhood. These have sometimes included passive suicidal ideation which on one occasion included a plan while Jonathan [name] was in graduate school. His plan at the time was to overdose on sleeping pills. He was able to call suicide prevention and did not act on these thoughts. He described the potential impact on his family as a major barrier to acting on suicidal ideation. Family of Origin   Jonathan [name]'s mother died in 12-07-2014 at the age of 43. He described her mental health while alive as "horrendous," and shared suspicion that she may have experienced  childhood abuse that resulted in her being sent away to a boarding school. Jonathan [name]'s maternal uncle was schizophrenic and institutionalized for much of his life. Jonathan [name] and his brother obtained power of attorney toward the end of her life. Toward the end of her life, she became convinced that a variety of inanimate objects were sending her message. Jonathan [name] has a brother who is three years younger. Jonathan [name]'s father passed away in 12-07-2019. He described a good relationship with his brother and nephews.    No needs/concerns related to ethnicity reported when asked: [name] reported that three of his four grandparents were [name] immigrants. Much of his upbringing was focused on [name] culture. He described himself as "recovering" from the [name]. He has taken Buddhist vows.    Medical Issues Jonathan [name] sees Dr. Phillip [name] for medication management. He is prescribed [name], [name]. Sleep aids as needed. He described himself as healthy overall, with minimal medical issues.  Leisure Activities/Daily Functioning   Jonathan [name] enjoys crossword puzzles, reading, movies, cooking, walking outside, listening to music. He has not spent as much time engaged in his leisure activities since the semester started at [name].   Substance use [name] reported that he does not drink alcohol during the semester, but occasionally has a glass of wine or beer over the summer. He does not smoke.  Legal Status   No Legal Problems  Other   General Behavior: cooperative  Attire: appropriate  Gait: normal  Motor Activity: normal  Stream of Thought - Productivity: spontaneous  Stream of thought - Progression: normal  Stream of thought - Language: normal  Emotional tone  and reactions - Mood: normal  Emotional tone and reactions - Affect: appropriate  Mental trend/Content of thoughts - Perception: normal  Mental trend/Content of thoughts - Orientation: normal  Mental trend/Content of thoughts - Memory: normal  Mental  trend/Content of thoughts - General knowledge: consistent with education  Insight: good  Judgment: good  Intelligence: average  Diagnostic Summary   Dysthmia, Generalized Anxiety Disorder         Treatment Plan Client Abilities/Strengths  Jonathan [name] is open to feedback in a therapeutic setting and has found constructive criticism helpful in the past. He has been engaged and motivated in therapy for several years.   Client Treatment Preferences:  He is open to virtual and in-person appointments.  Client Statement of Needs  Jonathan [name] is seeking therapy for emotional support and stress reduction in his relationship and day-to-day life.  Treatment Level   Jonathan [name] typically benefits from therapy weekly to every two months, depending on the state of his mental health. Symptoms  Perfectionistic tendencies, occasional depressive episodes, anxiety related to a range of situations Problems Addressed  Goals 1. Jonathan [name] would like to develop strategies and gain support in order to prevent severe depressive episodes.  Objective  Target Date: 09/27/2023 Frequency: 2 weeks  Progress: 50% Modality: Individual therapy  Related Interventions Jonathan [name] will be provided opportunities to discuss his experiences in session. Therapist will use CBT based strategies to help Jonathan [name] identify and disengage from maladaptive thoughts and behaviors. Therapist will incorporate behavior activation strategies as appropriate. Therapist will provide emotion regulation strategies, including meditation, mindfulness, and self-care, as appropriate.  Therapist will provide referrals for additional resources as appropriate.   Related Interventions Diagnosis Axis none  Dysthmia, generalized anxiety disorder   Axis none    Conditions For Discharge Achievement of treatment goals and objectives   Jonathan Noa, PhD               Jonathan Noa, PhD

## 2022-11-24 ENCOUNTER — Ambulatory Visit: Payer: BC Managed Care – PPO | Admitting: Clinical

## 2022-11-24 DIAGNOSIS — F341 Dysthymic disorder: Secondary | ICD-10-CM | POA: Diagnosis not present

## 2022-11-24 DIAGNOSIS — F411 Generalized anxiety disorder: Secondary | ICD-10-CM | POA: Diagnosis not present

## 2022-11-24 NOTE — Progress Notes (Signed)
Time: 9:03 am-9:58 am CPT Code: 16109U Diagnosis Code: F41.1, F34.1  Jonathan [name] was seen in person for individual therapy. He reported stable mood since his last session. He also reported having come to terms somewhat with dynamics in a relationship with a student, and reported having ideas for healthy boundary setting. Session focused on processing formative relationships in his life in order to better understanding dynamics in his current relationships. Therapist suggested reading the book "Come as You Are" by Despina Arias with his wife. He is scheduled to be seen again in one month, and will reach out if he needs to be seen sooner.      Intake  Presenting Problem  Jonathan [name] shared that he has been in therapy since Dec 24, 1983. He has been working with Dr. Doran Heater for the past 15 years. Recently, he has been working to manage anxiety, perfectionism, grief over the loss of his parents, and issues in relationships. 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 His most recent episode of catatonic depression was in 2013-2016. 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 His wife teaches Engineering geologist and information studies at [name]. Symptoms  Difficulty sleeping, perfectionism, difficulty coping when situations do not meet expectations, tendency toward cognitive rigidity, recurrent major depression including depressed mood that have occurred periodically since Jonathan [name]'s early childhood. These have sometimes included passive suicidal ideation which on one occasion included a plan while Jonathan [name] was in graduate school. His plan at the time was to overdose on sleeping pills. He was able to call suicide prevention and did not act on these thoughts. He described the potential impact on his family as a major barrier to acting on  suicidal ideation. Family of Origin   Jonathan [name] mother died in 12-24-2014 at the age of 46. He described her mental health while alive as "horrendous," and shared suspicion that she may have experienced childhood abuse that resulted in her being sent away to a boarding school. Jonathan [name] maternal uncle was schizophrenic and institutionalized for much of his life. Jonathan [name] and his brother obtained power of attorney toward the end of her life. Toward the end of her life, she became convinced that a variety of inanimate objects were sending her message. Jonathan [name] has a brother who is three years younger. Jonathan [name] father passed away in 12/24/2019. He described a good relationship with his brother and nephews.    No needs/concerns related to ethnicity reported when asked: Jonathan [name] reported that three of his four grandparents were [name] immigrants. Much of his upbringing was focused on [name] culture. He described himself as "recovering" from the [name]. He has taken Buddhist vows.    Medical Issues Jonathan [name] sees [name] for medication management. He is prescribed buproprion, citalopram. Sleep aids as needed. He described himself as healthy overall, with minimal medical issues.  Leisure Activities/Daily Functioning   [name] enjoys crossword puzzles, reading, movies, cooking, walking outside, listening to music. He has not spent as much time engaged in his leisure activities since the semester started at [place] [name].   Substance use [name] reported that he does not drink alcohol during the semester, but occasionally has a glass of wine or beer over the summer. He does not smoke.  Legal Status   No Legal Problems  Other   General Behavior:  cooperative  Attire: appropriate  Gait: normal  Motor Activity: normal  Stream of Thought - Productivity: spontaneous  Stream of thought - Progression: normal  Stream of thought - Language: normal  Emotional tone and reactions - Mood: normal  Emotional tone and reactions - Affect:  appropriate  Mental trend/Content of thoughts - Perception: normal  Mental trend/Content of thoughts - Orientation: normal  Mental trend/Content of thoughts - Memory: normal  Mental trend/Content of thoughts - General knowledge: consistent with education  Insight: good  Judgment: good  Intelligence: average  Diagnostic Summary   Dysthmia, Generalized Anxiety Disorder         Treatment Plan Client Abilities/Strengths  Abdallah is open to feedback in a therapeutic setting and has found constructive criticism helpful in the past. He has been engaged and motivated in therapy for several years.   Client Treatment Preferences:  He is open to virtual and in-person appointments.  Client Statement of Needs  Byrd is seeking therapy for emotional support and stress reduction in his relationship and day-to-day life.  Treatment Level   Trong typically benefits from therapy weekly to every two months, depending on the state of his mental health. Symptoms  Perfectionistic tendencies, occasional depressive episodes, anxiety related to a range of situations Problems Addressed  Goals 1. Quavis would like to develop strategies and gain support in order to prevent severe depressive episodes.  Objective  Target Date: 09/27/2023 Frequency: 2 weeks  Progress: 50% Modality: Individual therapy  Related Interventions Kelynn will be provided opportunities to discuss his experiences in session. Therapist will use CBT based strategies to help Rocci identify and disengage from maladaptive thoughts and behaviors. Therapist will incorporate behavior activation strategies as appropriate. Therapist will provide emotion regulation strategies, including meditation, mindfulness, and self-care, as appropriate.  Therapist will provide referrals for additional resources as appropriate.   Related Interventions Diagnosis Axis none  Dysthmia, generalized anxiety disorder   Axis none    Conditions For Discharge Achievement of  treatment goals and objectives   [name], PhD               [name], PhD

## 2022-12-22 ENCOUNTER — Ambulatory Visit: Payer: BC Managed Care – PPO | Admitting: Clinical

## 2022-12-22 DIAGNOSIS — F341 Dysthymic disorder: Secondary | ICD-10-CM | POA: Diagnosis not present

## 2022-12-22 DIAGNOSIS — F411 Generalized anxiety disorder: Secondary | ICD-10-CM

## 2022-12-22 NOTE — Progress Notes (Signed)
Time: 9:03 am-9:58 am CPT Code: 14782N Diagnosis Code: F41.1, F34.1  Jonathan [name] was seen in person for individual therapy. He reported an increase in depressive symptoms, including suicidal ideation without plan or intent. He assured the therapist that he would be safe, and listed barriers including the impact on those around him, especially his wife. He also speculated that the increase in depressive symptoms is likely due to decreased frequency of meditation and exercise. Session included creating a plan to bring back these habits, and he indicated intent to go swimming immediately following session. He is scheduled to be seen again in 10 days, and agreed to reach out if suicidal ideation worsens.      Intake  Presenting Problem  Jonathan [name] that he has been in therapy since 01/19/1984. He has been working with Jonathan [name] for the past 15 years. Recently, he has been working to manage anxiety, perfectionism, grief over the loss of his parents, and issues in relationships. 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 His most recent episode of catatonic depression was in 2013-2016. 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 He and his wife moved to [place] in 01/18/02. He has been teaching at [employer] since 01-18-2002, and his wife obtained a doctorate in clarinette. His wife teaches Engineering geologist and information studies at [employer]. Symptoms  Difficulty sleeping, perfectionism, difficulty coping when situations do not meet expectations, tendency toward cognitive rigidity, recurrent major depression including depressed mood that have occurred periodically since Jonathan [name] early childhood. These have sometimes included passive suicidal ideation which on one occasion included a plan while Jonathan [name] was in graduate school. His plan at the time was to overdose on sleeping pills. He was able to call suicide prevention and did not act on these thoughts. He described  the potential impact on his family as a major barrier to acting on suicidal ideation. Family of Origin   Jonathan [name]'s mother died in Jan 19, 2015 at the age of 102. He described her mental health while alive as "horrendous," and [name] suspicion that she may have experienced childhood abuse that resulted in her being sent away to a boarding school. Jonathan [name]'s maternal uncle was schizophrenic and institutionalized for much of his life. Jonathan [name] and his brother obtained power of attorney toward the end of her life. Toward the end of her life, she became convinced that a variety of inanimate objects were sending her message. Jonathan [name] has a brother who is three years younger. Augusten's father passed away in 19-Jan-2020. He described a good relationship with his brother and nephews.    No needs/concerns related to ethnicity reported when asked: Jonathan [name] reported that three of his four grandparents were [name] immigrants. Much of his upbringing was focused on [name] culture. He described himself as "recovering" from the [name]. He has taken Buddhist vows.    Medical Issues Jonathan [name] Jonathan [name] for medication management. He is prescribed buproprion, citalopram. Sleep aids as needed. He described himself as healthy overall, with minimal medical issues.  Leisure Activities/Daily Functioning   Jonathan [name] enjoys crossword puzzles, reading, movies, cooking, walking outside, listening to music. He has not spent as much time engaged in his leisure activities since the semester started at [employer].   Substance use Jonathan [name] reported that he does not drink alcohol during the semester, but occasionally has a glass of wine or beer over the summer. He does not smoke.  Legal Status   No Legal  Problems  Other   General Behavior: cooperative  Attire: appropriate  Gait: normal  Motor Activity: normal  Stream of Thought - Productivity: spontaneous  Stream of thought - Progression: normal  Stream of thought - Language: normal  Emotional tone and  reactions - Mood: normal  Emotional tone and reactions - Affect: appropriate  Mental trend/Content of thoughts - Perception: normal  Mental trend/Content of thoughts - Orientation: normal  Mental trend/Content of thoughts - Memory: normal  Mental trend/Content of thoughts - General knowledge: consistent with education  Insight: good  Judgment: good  Intelligence: average  Diagnostic Summary   Dysthmia, Generalized Anxiety Disorder         Treatment Plan Client Abilities/Strengths  Jerry is open to feedback in a therapeutic setting and has found constructive criticism helpful in the past. He has been engaged and motivated in therapy for several years.   Client Treatment Preferences:  He is open to virtual and in-person appointments.  Client Statement of Needs  Zaelen is seeking therapy for emotional support and stress reduction in his relationship and day-to-day life.  Treatment Level   Anas typically benefits from therapy weekly to every two months, depending on the state of his mental health. Symptoms  Perfectionistic tendencies, occasional depressive episodes, anxiety related to a range of situations Problems Addressed  Goals 1. Rodrigue would like to develop strategies and gain support in order to prevent severe depressive episodes.  Objective  Target Date: 09/27/2023 Frequency: 2 weeks  Progress: 50% Modality: Individual therapy  Related Interventions Ajdin will be provided opportunities to discuss his experiences in session. Therapist will use CBT based strategies to help Ludy identify and disengage from maladaptive thoughts and behaviors. Therapist will incorporate behavior activation strategies as appropriate. Therapist will provide emotion regulation strategies, including meditation, mindfulness, and self-care, as appropriate.  Therapist will provide referrals for additional resources as appropriate.   Related Interventions Diagnosis Axis none  Dysthmia, generalized anxiety  disorder   Axis none    Conditions For Discharge Achievement of treatment goals and objectives     [name], PhD               [name], PhD

## 2023-01-01 ENCOUNTER — Ambulatory Visit: Payer: BC Managed Care – PPO | Admitting: Clinical

## 2023-01-01 DIAGNOSIS — F341 Dysthymic disorder: Secondary | ICD-10-CM

## 2023-01-01 DIAGNOSIS — F411 Generalized anxiety disorder: Secondary | ICD-10-CM

## 2023-01-01 NOTE — Progress Notes (Unsigned)
Time: 4:00 pm-4:56 pm CPT Code: 78295A Diagnosis Code: F41.1, F34.1  Jonathan [name] was seen in person for individual therapy. He reported continued increase in depressive symptoms, including frequent suicidal ideation without plan. He again reported that he would never commit suicide due to the potential impact on loved ones. Session consisted of creating a safety plan (see below). Jonathan [name] agreed to follow the plan and indicated that he believed he would be able to. He was provided a copy of the safety plan. He also agreed to share the plan with his wife. He will go to the emergency room if symptoms worsen. He is scheduled to be seen again in  three weeks, and agreed to reach out if suicidal ideation worsens. Therapist will also reach out to schedule him sooner as opportunities arise.   Coping plan I commit to: Regular runs (every other day) Biking or swimming on days when not running Daily meditation Giving problems over to a higher power Think of three things I am grateful for every day One small positive step daily Focused listening to music daily Take medication as prescribed  Call referral options (TMS treatment, partial hospitalization, ketamine treatment, DBT group) Go to emergency room  Reach out to therapist (NOT IN AN EMERGENCY-in emergency I will go to ER)     Intake  Presenting Problem  Jonathan [name] shared that he has been in therapy since [birth year]Jan[birth year]1984. He has been working with Dr. Doran [name] for the past 15 years. Recently, he has been working to manage anxiety, perfectionism, grief over the loss of his parents, and issues in relationships. 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 His most recent episode of catatonic depression was in 2013-2016. 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 He and his wife moved to [place] in 2002/01/29. He has been teaching at [employer] since 01/29/02, and his wife obtained a doctorate in clarinette. His  wife teaches Engineering geologist and information studies at [employer]. Symptoms  Difficulty sleeping, perfectionism, difficulty coping when situations do not meet expectations, tendency toward cognitive rigidity, recurrent major depression including depressed mood that have occurred periodically since Jonathan [name]'s early childhood. These have sometimes included passive suicidal ideation which on one occasion included a plan while Jonathan [name] was in graduate school. His plan at the time was to overdose on sleeping pills. He was able to call suicide prevention and did not act on these thoughts. He described the potential impact on his family as a major barrier to acting on suicidal ideation. Family of Origin   Jonathan [name]'s mother died in 01/30/2015 at the age of 43. He described her mental health while alive as "horrendous," and shared suspicion that she may have experienced childhood abuse that resulted in her being sent away to a boarding school. Jonathan [name]'s maternal uncle was schizophrenic and institutionalized for much of his life. Jonathan [name] and his brother obtained power of attorney toward the end of her life. Toward the end of her life, she became convinced that a variety of inanimate objects were sending her message. Jonathan [name] has a brother who is three years younger. Jonathan [name]'s father passed away in 01-30-2020. He described a good relationship with his brother and nephews.    No needs/concerns related to ethnicity reported when asked: Jonathan [name] reported that three of his four grandparents were [name] immigrants. Much of his upbringing was focused on [name] culture. He described himself as "recovering" from the [name]. He has taken Buddhist vows.  Medical Issues Jonathan [name] sees Dr. Phillip [name] for medication management. He is prescribed buproprion, citalopram. Sleep aids as needed. He described himself as healthy overall, with minimal medical issues.  Leisure Activities/Daily Functioning   Jonathan [name] enjoys crossword puzzles, reading, movies, cooking, walking  outside, listening to music. He has not spent as much time engaged in his leisure activities since the semester started at [employer].   Substance use Jonathan [name] reported that he does not drink alcohol during the semester, but occasionally has a glass of wine or beer over the summer. He does not smoke.  Legal Status   No Legal Problems  Other   General Behavior: cooperative  Attire: appropriate  Gait: normal  Motor Activity: normal  Stream of Thought - Productivity: spontaneous  Stream of thought - Progression: normal  Stream of thought - Language: normal  Emotional tone and reactions - Mood: normal  Emotional tone and reactions - Affect: appropriate  Mental trend/Content of thoughts - Perception: normal  Mental trend/Content of thoughts - Orientation: normal  Mental trend/Content of thoughts - Memory: normal  Mental trend/Content of thoughts - General knowledge: consistent with education  Insight: good  Judgment: good  Intelligence: average  Diagnostic Summary   Dysthmia, Generalized Anxiety Disorder         Treatment Plan Client Abilities/Strengths  Jonathan [name] is open to feedback in a therapeutic setting and has found constructive criticism helpful in the past. He has been engaged and motivated in therapy for several years.   Client Treatment Preferences:  He is open to virtual and in-person appointments.  Client Statement of Needs  Jonathan [name] is seeking therapy for emotional support and stress reduction in his relationship and day-to-day life.  Treatment Level   Jonathan [name] typically benefits from therapy weekly to every two months, depending on the state of his mental health. Symptoms  Perfectionistic tendencies, occasional depressive episodes, anxiety related to a range of situations Problems Addressed  Goals 1. Jonathan [name] would like to develop strategies and gain support in order to prevent severe depressive episodes.  Objective  Target Date: 09/27/2023 Frequency: 2 weeks  Progress: 50% Modality:  Individual therapy  Related Interventions Jonathan [name] will be provided opportunities to discuss his experiences in session. Therapist will use CBT based strategies to help Jonathan [name] identify and disengage from maladaptive thoughts and behaviors. Therapist will incorporate behavior activation strategies as appropriate. Therapist will provide emotion regulation strategies, including meditation, mindfulness, and self-care, as appropriate.  Therapist will provide referrals for additional resources as appropriate.   Related Interventions Diagnosis Axis none  Dysthmia, generalized anxiety disorder   Axis none    Conditions For Discharge Achievement of treatment goals and objectives       Jonathan Noa, PhD               Jonathan Noa, PhD

## 2023-01-20 ENCOUNTER — Telehealth (HOSPITAL_COMMUNITY): Payer: Self-pay

## 2023-01-20 NOTE — Telephone Encounter (Signed)
 TMS C: Received incoming email from [facility] provider - initial re-rerouting of vm. Coordinator experiencing tech issues ([employer] vm), referral sent via email. Coordinator will review chart and contact pt for TMS phone screening through office line.

## 2023-01-21 ENCOUNTER — Telehealth (HOSPITAL_COMMUNITY): Payer: Self-pay

## 2023-01-21 NOTE — Telephone Encounter (Signed)
 TMS C: Placed outgoing call to pt - no answer, left vm requesting call back to perform TMS phone screening/general info. Coordinator stated they could also send an email if pt's workday does not allow time for phone call before 4:30pm. Will await pt response.

## 2023-01-21 NOTE — Telephone Encounter (Signed)
 TMS C: Pt returned call shortly after vm was left. Coordinator conducted [employer] phone screen to gauge eligibility and confirm identifiers. Pt confirmed insurance is through [employer] administrator, sports via [place]). Pt scored a 19 on PHQ-9. Coordinator requested verbal consent to email pt a BDI assessment (additional clinicals), and the ROI form to receive med recs from psychiatrist and therapist. Pt was agreeable to email and med recs request; provided verbal consent. Pt will fill out documents and call insurance payor to request benefit verification for TMS tx. Will await pt response before scheduling consult.

## 2023-01-21 NOTE — Telephone Encounter (Signed)
 TMS C: BDI assessment and Release of information form was emailed to pt to complete and send back.

## 2023-01-23 ENCOUNTER — Telehealth (HOSPITAL_COMMUNITY): Payer: Self-pay

## 2023-01-23 NOTE — Telephone Encounter (Signed)
 TMS C: Received email with attachments from pt of completed BDI assessment and ROI forms. Pt scored a 27 on the Beck's (moderate depression) and a 19 on PHQ-9 (Mod Severe Depression). Pt provided consent to request external med records. No contraindications found. Coordinator then reached out to Provider to assess availability for TMS consultation. Coordinator will contact pt for scheduling.

## 2023-01-26 ENCOUNTER — Ambulatory Visit: Payer: 59 | Admitting: Clinical

## 2023-01-26 ENCOUNTER — Telehealth (HOSPITAL_COMMUNITY): Payer: Self-pay

## 2023-01-26 DIAGNOSIS — F341 Dysthymic disorder: Secondary | ICD-10-CM

## 2023-01-26 DIAGNOSIS — F411 Generalized anxiety disorder: Secondary | ICD-10-CM | POA: Diagnosis not present

## 2023-01-26 NOTE — Telephone Encounter (Signed)
 TMS C: Sent outgoing email to pt confirming documents had been received (ROI form not wanting to print, can be redone) and TMS consult had been approved for scheduling. Coordinator provided available times/dates for pt to arrive. Alerted pt that Coordinator would also be in attendance to review/sign off on all new pt ppw. Once appt was confirmed, another email would be sent with all pertinent details (address, etc). Will await pt response for scheduling.

## 2023-01-26 NOTE — Progress Notes (Signed)
 Time: 12:00 pm-12:56 pm CPT Code: 09162E Diagnosis Code: F41.1, F34.1  Tevyn was seen in person for individual therapy. He reported continued depressive symptoms, including suicidal ideation with intent but without plan. He conitnues to endorse significant barriers, including the impact on others in his life, and assured the therapist that won't happen. He had spoken with his psychiatrist and increased his buspirone dosage, but was in the process of tapering down after his wife noticed possible manic symptoms. These included 2-3 days of difficulty sleeping, as well as an increase in anxiety and rapid talking. Therapist completed the MDQ with him, and he endorsed 4 symptoms which occurred together, causing minor problems, placing him in the sub threshold range. He indicated that he believes symptoms may have resulted from the transition back home after traveling over the holidays. He had shared his coping plan with his wife and she is aware of suicidal ideation. He had also initiated enrollment into the TMS program, and had called about DBT. He is scheduled to be seen again on 2/3, and indicated the he could follow his coping plan and would reach out if he needs to be seen sooner.  Coping plan I commit to: Regular runs (every other day) Biking or swimming on days when not running Daily meditation Giving problems over to a higher power Think of three things I am grateful for every day One small positive step daily Focused listening to music daily Take medication as prescribed  Call referral options (TMS treatment, partial hospitalization, ketamine treatment, DBT group) Go to emergency room  Reach out to therapist (NOT IN AN EMERGENCY-in emergency I will go to ER)     Intake  Presenting Problem  Jonathan [name] shared that he has been in therapy since Jan 08, 1984. He has been working with Dr. Rollene Das for the past 15 years. Recently, he has been working to manage anxiety, perfectionism, grief over the  loss of his parents, and issues in relationships. 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 His most recent episode of catatonic depression was in 2013-2016. 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 He has been teaching at [employer] since 2002-01-07, and his wife obtained a doctorate in clarinette. His wife teaches engineering geologist and information studies at [employer]. Symptoms  Difficulty sleeping, perfectionism, difficulty coping when situations do not meet expectations, tendency toward cognitive rigidity, recurrent major depression including depressed mood that have occurred periodically since Tarik's early childhood. These have sometimes included passive suicidal ideation which on one occasion included a plan while Wenzel was in graduate school. His plan at the time was to overdose on sleeping pills. He was able to call suicide prevention and did not act on these thoughts. He described the potential impact on his family as a major barrier to acting on suicidal ideation. Family of Origin   Lane's mother died in 01/08/2015 at the age of 12. He described her mental health while alive as "horrendous," and shared suspicion that she may have experienced childhood abuse that resulted in her being sent away to a boarding school. Undra's maternal uncle was schizophrenic and institutionalized for much of his life. Jonathan [name] and his brother obtained power of attorney toward the end of her life. Toward the end of her life, she became convinced that a variety of inanimate objects were sending her message. Kalix has a brother who is three years younger. Eryx's father  passed away in 2021. He described a good relationship with his brother and nephews.    No needs/concerns related to ethnicity reported when asked: Mitcheal reported that three of his four grandparents were [place] immigrants. Much of his upbringing was focused on Italian American  culture. He described himself as "recovering" from the [name]. He has taken Buddhist vows.    Medical Issues Damione sees Dr. Slater Mam for medication management. He is prescribed buproprion, citalopram. Sleep aids as needed. He described himself as healthy overall, with minimal medical issues.  Leisure Activities/Daily Functioning   Damien enjoys crossword puzzles, reading, movies, cooking, walking outside, listening to music. He has not spent as much time engaged in his leisure activities since the semester started at [employer].   Substance use Kaylem reported that he does not drink alcohol during the semester, but occasionally has a glass of wine or beer over the summer. He does not smoke.  Legal Status   No Legal Problems  Other   General Behavior: cooperative  Attire: appropriate  Gait: normal  Motor Activity: normal  Stream of Thought - Productivity: spontaneous  Stream of thought - Progression: normal  Stream of thought - Language: normal  Emotional tone and reactions - Mood: normal  Emotional tone and reactions - Affect: appropriate  Mental trend/Content of thoughts - Perception: normal  Mental trend/Content of thoughts - Orientation: normal  Mental trend/Content of thoughts - Memory: normal  Mental trend/Content of thoughts - General knowledge: consistent with education  Insight: good  Judgment: good  Intelligence: average  Diagnostic Summary   Dysthmia, Generalized Anxiety Disorder         Treatment Plan Client Abilities/Strengths  Basem is open to feedback in a therapeutic setting and has found constructive criticism helpful in the past. He has been engaged and motivated in therapy for several years.   Client Treatment Preferences:  He is open to virtual and in-person appointments.  Client Statement of Needs  Alija is seeking therapy for emotional support and stress reduction in his relationship and day-to-day life.  Treatment Level   Chancelor typically benefits  from therapy weekly to every two months, depending on the state of his mental health. Symptoms  Perfectionistic tendencies, occasional depressive episodes, anxiety related to a range of situations Problems Addressed  Goals 1. Buckley would like to develop strategies and gain support in order to prevent severe depressive episodes.  Objective  Target Date: 09/27/2023 Frequency: 2 weeks  Progress: 50% Modality: Individual therapy  Related Interventions Mckinley will be provided opportunities to discuss his experiences in session. Therapist will use CBT based strategies to help Adrean identify and disengage from maladaptive thoughts and behaviors. Therapist will incorporate behavior activation strategies as appropriate. Therapist will provide emotion regulation strategies, including meditation, mindfulness, and self-care, as appropriate.  Therapist will provide referrals for additional resources as appropriate.   Related Interventions Diagnosis Axis none  Dysthmia, generalized anxiety disorder   Axis none    Conditions For Discharge Achievement of treatment goals and objectives                   Andriette LITTIE Ponto, PhD               Andriette LITTIE Ponto, PhD

## 2023-01-30 ENCOUNTER — Telehealth (HOSPITAL_COMMUNITY): Payer: Self-pay

## 2023-01-30 NOTE — Telephone Encounter (Signed)
TMS C: Received incoming email from pt with update from insurance payor Administrator) on benefits. Pt stated that their insurance began to ask for specific codes, treatment location, etc, and pt was unable to provide info. Pt stated when asking about coverage, payor "left information vague, but did confirm they were in-network. They said that my deductible is $1250 and that I would pay up to 75% of the fees but they didn't say when, or what those fees are."  Coordinator responded with apologies about confusion/runaround from payor, and would contact them directly on Monday for better understanding of benefits. Coordinator alerted pt that they would give them a call and follow-up email afterward with all info provided.

## 2023-02-02 ENCOUNTER — Telehealth (HOSPITAL_COMMUNITY): Payer: Self-pay

## 2023-02-02 NOTE — Telephone Encounter (Signed)
TMS C: Sent pt outgoing email to explain benefit investigation via Community education officer. Coordinator stated that exact treatment cost could not be provided due to lack of access to billing information. Coordinator explained that pt would be responsible for 75% of treatment session cost until deductible had been met. Coordinator provided average costs and explained that pt would meet deductible after about 4 treatment sessions. Monia Pouch would cover 100% for the remaining 32 sessions. Coordinator informed pt that if this seemed doable, to schedule consult asap. If they want to see exact numbers, pt could go through with consult, wait a week for it to reflect in [employer] mobile app, and then let Coordinator know if they want to proceed. Will await pt response before scheduling.

## 2023-02-09 ENCOUNTER — Telehealth (HOSPITAL_COMMUNITY): Payer: Self-pay

## 2023-02-09 NOTE — Telephone Encounter (Signed)
TMS C: Received incoming email from pt. Pt confirmed they would wait for Coordinator to obtain & provide TMS program update before potentially being referred to [facility].

## 2023-02-09 NOTE — Telephone Encounter (Signed)
TMS C: Received incoming email from pt consenting to & requesting TMS consult appt. Pt would like for it to be scheduled around Feb 28th, but earlier would be better.   Coordinator relayed message to [employer] Resident. TMS Resident advised delay in new consults until new attending could be selected & trained, but will let Coordinator know by end of week.

## 2023-02-09 NOTE — Telephone Encounter (Signed)
TMS C: Relayed program and consultation updates to pt via outgoing email. Coordinator explained that consult will need to be tentatively paused until a solidified answer regarding the TMS program is received (vacant attending position). Coordinator apologized for inconvenience but explained that if pt needs treatment sooner, they can refer the pt to another facility with the same machinery/technology. Will await pt response.

## 2023-02-16 ENCOUNTER — Ambulatory Visit (INDEPENDENT_AMBULATORY_CARE_PROVIDER_SITE_OTHER): Payer: 59 | Admitting: Clinical

## 2023-02-16 DIAGNOSIS — F411 Generalized anxiety disorder: Secondary | ICD-10-CM

## 2023-02-16 DIAGNOSIS — F341 Dysthymic disorder: Secondary | ICD-10-CM
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
## 2023-02-17 ENCOUNTER — Telehealth (HOSPITAL_COMMUNITY): Payer: Self-pay

## 2023-02-17 NOTE — Telephone Encounter (Signed)
 TMS C: Sent outgoing email to pt to relay latest TMS program update. Coordinator informed pt that a new attending was in the works to join the program, but small details & certification process needs to be complete before continuing intake process. Coordinator apologized for delay, thanked pt for their patience, then provided/emphasized a tentative certification date. Coordinator will ensure to reach out with additional updates and/or changes. Coordinator concluded email informing patient to not hesitate to reach out with any questions/concerns.

## 2023-02-25 ENCOUNTER — Telehealth (HOSPITAL_COMMUNITY): Payer: Self-pay

## 2023-02-25 NOTE — Telephone Encounter (Signed)
TMS C: Coordinator sent pt outgoing email to relay [employer] program update: Coordinator confirmed program requirements will be met and new attending will start seeing TMS patients after March 1st, 2025. Patient previously requested March 4th as another available day, Coordinator confirmed March 3rd,5th, and 6th are currently open on resident's schedule for TMS consult. Coordinator asked patient about their availability and informed them they would try to accommodate them where they could, as patient has been agreeable/kind during scheduling delays. Will await patient response before confirming availability with Resident Provider.

## 2023-03-02 ENCOUNTER — Telehealth (HOSPITAL_COMMUNITY): Payer: Self-pay

## 2023-03-02 ENCOUNTER — Ambulatory Visit: Payer: 59 | Admitting: Clinical

## 2023-03-02 DIAGNOSIS — F411 Generalized anxiety disorder: Secondary | ICD-10-CM

## 2023-03-02 DIAGNOSIS — F341 Dysthymic disorder: Secondary | ICD-10-CM | POA: Diagnosis not present
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
## 2023-03-02 NOTE — Telephone Encounter (Signed)
TMS C: Sent outgoing email (preferred contact method) to discuss TMS consult scheduling. Coordinator apologized for mix-up d/t resident provider's vacation week. Coordinator offered March 7th, 2025 between 2pm-4pm as best day to come in. Will await pt response before scheduling into system.

## 2023-03-03 ENCOUNTER — Telehealth (HOSPITAL_COMMUNITY): Payer: Self-pay

## 2023-03-03 NOTE — Telephone Encounter (Signed)
TMS C: Coordinator spoke with resident provider regarding scheduling and obtained several dates/times. Coordinator then placed outgoing call to pt. Pt elected to schedule their TMS consultation on 03/27/2023 at 2:00 pm @ [facility]. Coordinator will send email with MyChart link access & summary of appt details.

## 2023-03-09 ENCOUNTER — Telehealth (HOSPITAL_COMMUNITY): Payer: Self-pay

## 2023-03-09 NOTE — Telephone Encounter (Signed)
 TMS C: Placed outgoing call to pt - phone line was busy. Outgoing appointment reminder email sent with instructions/directions to appt location.

## 2023-03-10 ENCOUNTER — Other Ambulatory Visit (HOSPITAL_COMMUNITY): Payer: 59 | Attending: Psychiatry | Admitting: Student in an Organized Health Care Education/Training Program

## 2023-03-10 DIAGNOSIS — F411 Generalized anxiety disorder: Secondary | ICD-10-CM | POA: Diagnosis not present

## 2023-03-10 DIAGNOSIS — F331 Major depressive disorder, recurrent, moderate: Secondary | ICD-10-CM | POA: Diagnosis not present

## 2023-03-10 NOTE — Progress Notes (Signed)
 [facility] MD/PA/NP [employer] Consult Note  03/10/2023 3:17 PM Jonathan [name]  MRN:  [id number]  Chief Complaint:  Chief Complaint  Patient presents with   TMS Consult   HPI:  Jonathan [name] is a 53 yr old male who presents for TMS Consult.  PPHx is significant for Depression and Anxiety, and no history of Suicide Attempts, Self Injurious Behavior, or Psychiatric Hospitalizations.  Jonathan [name] reports that his symptoms first started in 1983.  Jonathan [name] reports his symptoms started off with anger, depression, anxiety, and substance abuse.  Jonathan [name] reports his first medication trial was in 2003.  Jonathan [name] reports initially Jonathan [name] was treated by a PCP before switching to his current psychiatrist.  Jonathan [name] reports that his symptoms always worsen at the beginning and end of semesters (with change).  Jonathan [name] reports that there have been no significant swings up or down but that it has been a constant downward trend of worsening symptoms.  Jonathan [name] reports a significant impact was when his friend completed suicide over a year ago.  Jonathan [name] reports Jonathan [name] currently takes Wellbutrin, BuSpar, and is transitioning from Celexa to Lexapro. Jonathan [name] reports past medication trials include Prozac, Paxil (headaches, fatigue), and 1-2 other medications that Jonathan [name] cannot remember the name of.  Jonathan [name] is also been in therapy.  Jonathan [name] reports past psychiatric history significant for depression and anxiety.  Jonathan [name] reports no history of suicide attempts.  Jonathan [name] reports no history of self-injurious behavior.  Jonathan [name] reports no history of psychiatric hospitalizations.  Jonathan [name] reports no significant past medical history.  Jonathan [name] reports past surgical history significant for wisdom teeth removal.  Jonathan [name] reports no history of head trauma.  Jonathan [name] reports no history of seizures.  Jonathan [name] reports NKDA.  Discussed TMS with him.  Discussed TMS process and study results on treatment resistant depression.  Discussed the first appointment and what is involved with finding the MT.  Discussed with him that afterwards treatment sessions would last  approximately 20 minutes.  Discussed that treatments are Monday through Friday for 6 weeks and then there is a taper period.  Discussed that there is no definitive timeline for how long results would last but that typically we expect at least 1 year of improvement to consider additional treatments in the future.  Discussed that Jonathan [name] should continue taking their medications as prescribed by their outpatient provider discussed that the tech would run the treatments afterwards with their parameters saved into the system, however, if any questions or issues arose I would be immediately available by phone or could be on site.  Discussed potential risks and side effects including but not limited to: Discomfort at site of magnet placement, headache, or seizure.  Confirmed that there was no implanted/retained metal in the head (aneurysm clips, plates, screws).  Confirmed that there is no significant dental/bridge work.  Confirmed that there is no implanted pacemaker/defibrillator.  Discussed that jewelry should not be worn or that it should be removed from the ear for treatment.  Discussed that treatments are done at [facility].  Discussed that treatment room can have lights dimmed, music played, or have the TV on during treatment sessions for comfort.  All questions were invited and answered. Jonathan [name] were agreeable to proceed with MT appointment.  Jonathan [name] reports no SI, HI, or AVH.    Visit Diagnosis:    ICD-10-CM   1. MDD (major depressive disorder), recurrent episode, moderate (HCC)  F33.1     2. GAD (generalized anxiety disorder)  F41.1       Past Psychiatric History: Depression  and Anxiety, and no history of Suicide Attempts, Self Injurious Behavior, or Psychiatric Hospitalizations.  Past Medical History:  Past Medical History:  Diagnosis Date   Anxiety    Depression     Past Surgical History:  Procedure Laterality Date   EXCISION MASS HEAD N/A 10/14/2019   Procedure: Excision of posterior scalp mass;  Surgeon:  Allena Napoleon, MD;  Location: [facility];  Service: Plastics;  Laterality: N/A;  45 min   WISDOM TOOTH EXTRACTION      Family Psychiatric History:  Mother- Delusional Disorder Maternal Uncle- Institutionalized most of his adult life  Family History:  Family History  Problem Relation Age of Onset   Colon cancer Neg Hx    Esophageal cancer Neg Hx    Rectal cancer Neg Hx    Stomach cancer Neg Hx     Social History:  Social History   Socioeconomic History   Marital status: Married    Spouse name: Not on file   Number of children: Not on file   Years of education: Not on file   Highest education level: Not on file  Occupational History   Not on file  Tobacco Use   Smoking status: Never   Smokeless tobacco: Never  Vaping Use   Vaping status: Never Used  Substance and Sexual Activity   Alcohol use: Yes    Comment: 2 times/wk   Drug use: No   Sexual activity: Not on file  Other Topics Concern   Not on file  Social History Narrative   Not on file   Social Drivers of Health   Financial Resource Strain: Not on file  Food Insecurity: Not on file  Transportation Needs: Not on file  Physical Activity: Not on file  Stress: Not on file  Social Connections: Not on file    Allergies: No Known Allergies  Metabolic Disorder Labs: No results found for: "HGBA1C", "MPG" No results found for: "PROLACTIN" No results found for: "CHOL", "TRIG", "HDL", "CHOLHDL", "VLDL", "LDLCALC" No results found for: "TSH"  Therapeutic Level Labs: No results found for: "LITHIUM" No results found for: "VALPROATE" No results found for: "CBMZ"  Current Medications: Current Outpatient Medications  Medication Sig Dispense Refill   buPROPion (WELLBUTRIN XL) 150 MG 24 hr tablet Take by mouth.     citalopram (CELEXA) 40 MG tablet Take 40 mg by mouth daily.     EPINEPHrine 0.3 mg/0.3 mL IJ SOAJ injection SMARTSIG:Injection As Directed (Patient not taking: Reported on 09/30/2019)      levocetirizine (XYZAL) 5 MG tablet SMARTSIG:1 Tablet(s) By Mouth Every Evening     ondansetron (ZOFRAN) 4 MG tablet Take 1 tablet (4 mg total) by mouth every 8 (eight) hours as needed for nausea or vomiting. 20 tablet 0   Current Facility-Administered Medications  Medication Dose Route Frequency Provider Last Rate Last Admin   0.9 %  sodium chloride infusion  500 mL Intravenous Once Armbruster, Willaim Rayas, MD         Musculoskeletal: Strength & Muscle Tone: within normal limits Gait & Station: normal Patient leans: N/A  Psychiatric Specialty Exam: Review of Systems  Respiratory:  Negative for cough and shortness of breath.   Cardiovascular:  Negative for chest pain.  Gastrointestinal:  Negative for abdominal pain, constipation, diarrhea, nausea and vomiting.  Neurological:  Negative for weakness and headaches.  Psychiatric/Behavioral:  Positive for dysphoric mood. Negative for hallucinations and suicidal ideas. The patient is nervous/anxious.     There were no vitals taken  for this visit.There is no height or weight on file to calculate BMI.  General Appearance: Casual and Fairly Groomed  Eye Contact:  Good  Speech:  Clear and Coherent and Normal Rate  Volume:  Normal  Mood:  Anxious and Dysphoric  Affect:  Congruent  Thought Process:  Coherent and Goal Directed  Orientation:  Full (Time, Place, and Person)  Thought Content: WDL and Logical   Suicidal Thoughts:  No  Homicidal Thoughts:  No  Memory:  Immediate;   Good Recent;   Good  Judgement:  Good  Insight:  Good  Psychomotor Activity:  Normal  Concentration:  Concentration: Good and Attention Span: Good  Recall:  Good  Fund of Knowledge: Good  Language: Good  Akathisia:  Negative  Handed:  Right  AIMS (if indicated): not done  Assets:  Communication Skills Desire for Improvement Financial Resources/Insurance Housing Physical Health Resilience Social Support  ADL's:  Intact  Cognition: WNL  Sleep:  Fair    Screenings:   Assessment and Plan:  Azari Hasler is a 53 yr old male who presents for TMS Consult.  PPHx is significant for Depression and Anxiety, and no history of Suicide Attempts, Self Injurious Behavior, or Psychiatric Hospitalizations.   Kolter would be a good candidate for TMS given his history of depression with multiple failed medication trials and therapy.  Jonathan [name] is interested in scheduling the MT appointment so we will proceed.   Collaboration of Care: Collaboration of Care:   Patient/Guardian was advised Release of Information must be obtained prior to any record release in order to collaborate their care with an outside provider. Patient/Guardian was advised if they have not already done so to contact the registration department to sign all necessary forms in order for [place] to release information regarding their care.   Consent: Patient/Guardian gives verbal consent for treatment and assignment of benefits for services provided during this visit. Patient/Guardian expressed understanding and agreed to proceed.    Lauro Franklin, MD 03/10/2023, 3:17 PM

## 2023-03-16 ENCOUNTER — Telehealth (HOSPITAL_COMMUNITY): Payer: Self-pay

## 2023-03-16 ENCOUNTER — Ambulatory Visit: Payer: Self-pay | Admitting: Clinical

## 2023-03-16 NOTE — Telephone Encounter (Signed)
 TMS C: Placed outgoing call to pt's insurance payor to request pre-certification for TMS tx. Coordinator was transferred twice before connecting with [facility] line. Coordinator spoke with CSR: Theodoro Grist T. Coordinator provided all requested information (no med trials or clinical data requested). Near end of call, CSR informed Coordinator that the request had been sent through and a clinician would be calling within a few days with decision. Coordinator interrupted CSR and requested auth form be faxed to direct office line, where all clinical information can be provided. Coordinator stated "no decision can be made when you all don't have the correct or most accurate information." CSR then tried to say they did not have a fax number at [facility] office. Coordinator restated request and provided details on last pre-cert performed for different pt a few months prior. CSR then told Coordinator a auth form will be faxed to direct office line. Coordinator will await form and fax once completed.

## 2023-03-16 NOTE — Telephone Encounter (Signed)
 TMS C: Faxed prior-authorization form and clinical docs to [employer]. Coordinator faxed PA to [phone] & [phone]. Will await payor decision before contacting pt.

## 2023-03-18 ENCOUNTER — Telehealth (HOSPITAL_COMMUNITY): Payer: Self-pay

## 2023-03-18 NOTE — Telephone Encounter (Signed)
 TMS C: Coordinator sent outgoing check-in email to pt. Coordinator informed pt of authorization status (pending) and should hear back soon, considering previous CSR submitted authorization before receiving paperwork. Coordinator concluded email letting pt know to please reach out for any questions/comments/concerns.

## 2023-03-19 ENCOUNTER — Telehealth (HOSPITAL_COMMUNITY): Payer: Self-pay

## 2023-03-19 NOTE — Telephone Encounter (Signed)
 TMS C: Pt responded to Coordinator's previous email stating that they "understand that bureaucracy takes time" and will await the final decision for TMS tx.

## 2023-03-25 ENCOUNTER — Telehealth (HOSPITAL_COMMUNITY): Payer: Self-pay

## 2023-03-25 NOTE — Telephone Encounter (Signed)
 TMS C: Received incoming call from pt's insurance payer Administrator). Rep left vm (no name provided)and informed Coordinator that pt's prior authorization was approved (ref# [id number]). Will contact providers and pt to schedule MT appt.

## 2023-03-25 NOTE — Telephone Encounter (Signed)
 TMS C: Placed outgoing call to pt. Call went to vm, left vm informing pt of approved authorization for TMS tx. Coordinator relayed they would also send an email with next steps & to discuss scheduling, d/t pt's work schedule. Will await pt response before scheduling.

## 2023-03-26 ENCOUNTER — Telehealth (HOSPITAL_COMMUNITY): Payer: Self-pay

## 2023-03-26 NOTE — Telephone Encounter (Signed)
 TMS C: Received incoming email from pt confirming that 04/10/23 at 1pm would work for their schedule for their TMS MT appt. Pt requested that before scheduling MT appt, they would like to schedule daily tx sessions first to ensure consistency. Pt also requested scheduled call to discuss more in length.   Coordinator responded to email informing pt that daily tx sessions are unable to be scheduled until MT appt is completed, as rare situations (inability to isolate thumb twitch, safety concerns, etc) could occur and may impact their treatment plan. Coordinator emphasized the rarity of this occurring, but specified the importance of completing MT first. Coordinator agreed to do unofficial scheduling d/t pt request, and will put into system following the MT appt. Coordinator requested pt's available times for a scheduled call to occur on 03/27/23. Will await pt response before informing providers and scheduling into MyChart.

## 2023-03-30 ENCOUNTER — Ambulatory Visit (INDEPENDENT_AMBULATORY_CARE_PROVIDER_SITE_OTHER): Payer: BC Managed Care – PPO | Admitting: Clinical

## 2023-03-30 DIAGNOSIS — F411 Generalized anxiety disorder: Secondary | ICD-10-CM | POA: Diagnosis not present

## 2023-03-30 DIAGNOSIS — F341 Dysthymic disorder: Secondary | ICD-10-CM | POA: Diagnosis not present
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
## 2023-04-01 ENCOUNTER — Telehealth (HOSPITAL_COMMUNITY): Payer: Self-pay

## 2023-04-01 NOTE — Telephone Encounter (Signed)
 TMS C: Pt requested if information received by [employer] via snail-mail was correct. Pt attached screenshot of approved CPT codes & units. Coordinator responded confirming information was accurate to payer's TMS approval. Coordinator provided explanation of CPT codes and Units before concluding email.

## 2023-04-10 ENCOUNTER — Other Ambulatory Visit (HOSPITAL_COMMUNITY): Attending: Psychiatry

## 2023-04-10 DIAGNOSIS — F331 Major depressive disorder, recurrent, moderate: Secondary | ICD-10-CM

## 2023-04-10 DIAGNOSIS — F411 Generalized anxiety disorder: Secondary | ICD-10-CM | POA: Insufficient documentation

## 2023-04-10 NOTE — Progress Notes (Signed)
 Pt reported to [facility] for cortical mapping and motor threshold determination for Repetitive Transcranial Magnetic Stimulation treatment for Major Depressive Disorder. Attending provider, Resident provider, and [employer] Tech were in attendance for session. Prior to procedure, pt signed an informed consent agreement for TMS treatment. Pt also completed a PHQ-9 with a score of 14.  Pt's treatment area was found by applying single pulses to their left motor cortex, hunting along the anterior/posterior plane and along the superior oblique angle until the best motor response was elicited from the pt's right thumb.?The best response was observed at 17.0 cm?A/P and 34?degrees SOA, with a coil angle of 0?degrees. Pt's motor threshold was calculated using the [employer]'s proprietary MT Assist algorithm. Per these findings, pt's treatment parameters are as follows: A/P -- 17 cm, SOA -- 34?degrees, Coil Angle -- ?0?degrees, Motor Threshold -- 1.11?SMT.?With these parameters, the pt will receive 36 sessions of TMS according to the following protocol: 3000 pulses per session, with stimulation in bursts of pulses lasting 4 seconds at a frequency of 10 Hz, separated by 11 seconds of rest. After determining pt's tx parameters, coil was moved to the treatment location, and the first burst of pulses was applied at a reduced power of 65%. Pt reported initial discomfort, TMS providers/tech assured pt that initial discomfort was normal but would ease. After the third pulse train, pt began to shift in tx chair and exhibit pain-like responses. Pt reported pain not being sharp, but very uncomfortable. Resident and Attending agreed to reduced power percentage to 60% to assist with overall toleration. Halfway through tx, pt began to cry with moderate intensity. Treatment team silently agreed to pause session with coil removed to allow pt time to regulate. Pt informed treatment team of feeling anxious/uncomfortable  with hospital setting, due to past personal experience with a family member being committed. Pt was able to regulate after a few minutes and consented to resuming tx. Treatment power percentage was increased in 5-minute increments; pt successfully ended the session at 70%. Upon completion of mapping, pt consented to waiting for a 5-minute interval for Attending and Resident provider's observation of side effects. No side effects were observed by providers or TMS tech. [employer] Tech and pt discussed scheduling for the next week's session. Pt departed from clinic without issue.

## 2023-04-13 ENCOUNTER — Other Ambulatory Visit: Payer: Self-pay | Admitting: Medical Genetics

## 2023-04-13 ENCOUNTER — Other Ambulatory Visit (HOSPITAL_COMMUNITY): Attending: Psychiatry

## 2023-04-13 ENCOUNTER — Ambulatory Visit (INDEPENDENT_AMBULATORY_CARE_PROVIDER_SITE_OTHER): Payer: BC Managed Care – PPO | Admitting: Clinical

## 2023-04-13 VITALS — BP 114/70 | HR 77 | Temp 98.0°F | Resp 20

## 2023-04-13 DIAGNOSIS — F341 Dysthymic disorder: Secondary | ICD-10-CM

## 2023-04-13 DIAGNOSIS — F331 Major depressive disorder, recurrent, moderate: Secondary | ICD-10-CM | POA: Insufficient documentation

## 2023-04-13 DIAGNOSIS — F411 Generalized anxiety disorder: Secondary | ICD-10-CM | POA: Diagnosis not present
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
## 2023-04-13 NOTE — Progress Notes (Signed)
 Patient and their wife reported to [facility] for session #2 of Repetitive Transcranial Magnetic Stimulation treatment for episode of recurrent major depressive disorder, without psychotic features. Patient presented with appropriate but quiet affect, level mood and denied any suicidal or homicidal ideations. Patient reported no change in alcohol/substance use, caffeine consumption, sleep pattern or metal implant status since previous tx.?Weekly Vital signs were conducted and read as follows: BP= 114/70 (sitting), pulse = 77, Temp = 98.0, Respiratory rate = 20. Pt also completed the weekly PHQ-9 with a score of 14.   Pt's treatment parameters are as follows: A/P -- 17 cm, SOA -- 34?degrees, Coil Angle -- ?0?degrees, Motor Threshold -- 1.11?SMT.?With these parameters, the pt will receive 36 sessions of TMS according to the following protocol: 3000 pulses per session, with stimulation in bursts of pulses lasting 4 seconds at a frequency of 10 Hz, separated by 11 seconds of rest. Coil was moved to the treatment location, and the first burst of pulses was applied at a reduced power of 65%, due to weekend break. Pt reported discomfort with initial pulse trains, but no sharp pain was felt. Pt elected to not talk during treatment. [employer] Tech agreed to silence, outside of mandatory check-in's regarding titration of tx power. Pt's wife offered occasional verbal support during tx (i.e., "you're doing a great job!"). TMS Tech titrated pt up to 70% after 5 minutes. No sharp pain or additional side effects were reported. Pt did request permission for wife to film session for a short amount of time to show social friend group. Pt explained that dark humor worked the best for them, and they can receive the "healthy bullying" from their social supports - which will then increase optimism for treatments. [employer] Tech agreed as long as camera stayed on pt only. Pt and their wife agreed. Treatment  power was increased to 75% after 6 minutes and 40 seconds; remained there until final 2 minutes and 30 seconds. TMS Tech decreased tx power to 70% to increase toleration and acclimation of pulse trains. Upon conclusion of the session, no dizziness or lightheadedness was reported by pt. Patient?and their wife departed post-treatment with no concerns or complaints.

## 2023-04-14 ENCOUNTER — Other Ambulatory Visit (HOSPITAL_COMMUNITY): Attending: Psychiatry

## 2023-04-14 ENCOUNTER — Telehealth (HOSPITAL_COMMUNITY): Payer: Self-pay

## 2023-04-14 DIAGNOSIS — F331 Major depressive disorder, recurrent, moderate: Secondary | ICD-10-CM | POA: Insufficient documentation

## 2023-04-14 NOTE — Telephone Encounter (Signed)
 Coordinator received incoming email from pt on 04/12/23 at 6pm. Pt provided their best/most preferred availability for the next 6 weeks for TMS treatment sessions. Coordinator responded on 04/14/23 at 9:30am letting pt know they were going through their calendars now and would have a tentative schedule that would be discussed more in depth at same-day appointment time. Coordinator agreed to send another email in similar format for pt to confirm before scheduling into epic.

## 2023-04-14 NOTE — Progress Notes (Signed)
 Patient reported alone to [facility] for session #3 of Repetitive Transcranial Magnetic Stimulation treatment for episode of major depressive disorder. Patient presented with appropriate affect, level mood and denied any suicidal or homicidal ideations. Patient reported no change in alcohol/substance use, caffeine consumption, sleep pattern or metal implant status since previous tx.?   Pt's treatment parameters are as follows: A/P -- 17 cm, SOA -- 34?degrees, Coil Angle -- ?0?degrees, Motor Threshold -- 1.11?SMT.?With these parameters, the pt will receive 36 sessions of TMS according to the following protocol: 3000 pulses per session, with stimulation in bursts of pulses lasting 4 seconds at a frequency of 10 Hz, separated by 11 seconds of rest. Coil was moved to the treatment location, and the first burst of pulses was applied at a reduced power of 70%. No discomfort or complaints were reported by pt. Pt elected to not chat during treatment, and instead listened to an NPR podcast. TMS Tech agreed to "silent appointment", outside of mandatory check-in's regarding titration of tx power. TMS Tech titrated pt up to 75% after 4 minutes. No sharp pain or additional discomfort was reported. TMS Tech observed the pt's increased toleration of pulse trains (decreased fidgeting in the chair, and decreased verbal complaints (i.e., "this sucks")). Treatment power was increased to 80% for the final 5 minutes of session, then decreased to 75% for final pulse train to increase overall acclimation. Upon conclusion of the session, no dizziness or lightheadedness was reported by pt. Patient?departed post-treatment with no concerns or complaints.

## 2023-04-15 ENCOUNTER — Other Ambulatory Visit (HOSPITAL_BASED_OUTPATIENT_CLINIC_OR_DEPARTMENT_OTHER)

## 2023-04-15 DIAGNOSIS — F331 Major depressive disorder, recurrent, moderate: Secondary | ICD-10-CM | POA: Diagnosis not present

## 2023-04-15 NOTE — Progress Notes (Signed)
 Patient reported alone to [facility] for session #4 of Repetitive Transcranial Magnetic Stimulation treatment for episode of major depressive disorder. Patient presented with appropriate affect, level mood and denied any suicidal or homicidal ideations. Patient reported no change in alcohol/substance use, caffeine consumption, sleep pattern or metal implant status since previous tx.?TMS Tech observed increased sociability from pt than from previous sessions. Pt exhibited increased talkativeness to Tech and other staff while walking to tx room.   Pt's treatment parameters are as follows: A/P -- 17 cm, SOA -- 34?degrees, Coil Angle -- ?0?degrees, Motor Threshold -- 1.11?SMT.?With these parameters, the pt will receive 36 sessions of TMS according to the following protocol: 3000 pulses per session, with stimulation in bursts of pulses lasting 4 seconds at a frequency of 10 Hz, separated by 11 seconds of rest. Coil was moved to the treatment location, and the first burst of pulses was applied at a reduced power of 75%. No discomfort or complaints were reported by pt. Pt listened to a novel podcast during tx. TMS Tech titrated pt up to 80% after 4 minutes. No sharp pain or discomfort was reported. TMS Tech observed pt's increased toleration of pulse trains through decreased fidgeting in the chair and increased motivation to engage in small talk. Treatment power was increased to 85% at 9 minutes and 40 seconds. Pt tolerated tx well. TMS Tech paused the session and obtained consent from pt to increase tx power to 90% for final 2 minutes of session. TMS Tech decreased tx power back to 85% for final pulse train to increase overall toleration.  Upon conclusion of the session, no dizziness or lightheadedness was reported by pt. Pt continued to small talk during escort back to lobby area. Pt departed post-treatment with no concerns or complaints

## 2023-04-16 ENCOUNTER — Other Ambulatory Visit (HOSPITAL_BASED_OUTPATIENT_CLINIC_OR_DEPARTMENT_OTHER)

## 2023-04-16 DIAGNOSIS — F332 Major depressive disorder, recurrent severe without psychotic features: Secondary | ICD-10-CM | POA: Diagnosis not present

## 2023-04-16 NOTE — Progress Notes (Signed)
 Patient reported alone to [facility] for session #5 of Repetitive Transcranial Magnetic Stimulation treatment for episode of major depressive disorder. Patient presented with appropriate affect, level mood and denied any suicidal or homicidal ideations. Patient reported no change in alcohol/substance use, caffeine consumption, sleep pattern or metal implant status since previous tx.?TMS Tech observed pt to be quiet and polite. Pt's affect was flat for duration of treatment.   Pt's treatment parameters are as follows: A/P -- 17 cm, SOA -- 34?degrees, Coil Angle -- ?0?degrees, Motor Threshold -- 1.11?SMT.?With these parameters, the pt will receive 36 sessions of TMS according to the following protocol: 3000 pulses per session, with stimulation in bursts of pulses lasting 4 seconds at a frequency of 10 Hz, separated by 11 seconds of rest. Coil was moved to the treatment location, and the first burst of pulses was applied at a reduced power of 75%. No discomfort or complaints were reported by pt. Pt sat quietly, occasionally wiping his eyes due to tearing up from the pulses, not from pain or sadness. during tx. TMS Tech titrated pt up to 80% after 4 minutes. No sharp pain or discomfort was reported. TMS Tech observed pt's increased toleration of pulse trains through decreased fidgeting in the chair and increased motivation to engage in small talk. Treatment power was increased to 85% at 4 minutes. Pt tolerated tx well.  Upon conclusion of the session, no dizziness or lightheadedness was reported by pt. Pt continued to small talk during escort back to lobby area. Pt departed post-treatment with no concerns or complaints

## 2023-04-17 ENCOUNTER — Other Ambulatory Visit (HOSPITAL_BASED_OUTPATIENT_CLINIC_OR_DEPARTMENT_OTHER)

## 2023-04-17 DIAGNOSIS — F332 Major depressive disorder, recurrent severe without psychotic features: Secondary | ICD-10-CM

## 2023-04-17 NOTE — Progress Notes (Addendum)
 Patient reported alone to [facility] for session #6 of Repetitive Transcranial Magnetic Stimulation treatment for episode of major depressive disorder. Patient presented with appropriate affect, level mood and denied any suicidal or homicidal ideations. Patient reported no change in alcohol/substance use, caffeine consumption, sleep pattern or metal implant status since previous tx.?TMS Tech observed pt to be quiet and polite. Pt's affect was flat for duration of treatment.   Pt's treatment parameters are as follows: A/P -- 17 cm, SOA -- 34?degrees, Coil Angle -- ?0?degrees, Motor Threshold -- 1.11?SMT.?With these parameters, the pt will receive 36 sessions of TMS according to the following protocol: 3000 pulses per session, with stimulation in bursts of pulses lasting 4 seconds at a frequency of 10 Hz, separated by 11 seconds of rest. Coil was moved to the treatment location, and the first burst of pulses was applied at a reduced power of 85%. No discomfort or complaints were reported by pt. Pt sat quietly, listening to a NPR podcast, occasionally wiping his eyes due to tearing up from the pulses, not from pain or sadness. during tx. TMS Tech titrated pt up to 90% after 13 minutes. No sharp pain or discomfort was reported. TMS Tech observed pt's increased toleration of pulse trains through decreased fidgeting in the chair and increased motivation to engage in small talk. Pt tolerated tx well.  Upon conclusion of the session, no dizziness or lightheadedness was reported by pt. Pt continued to small talk during escort back to lobby area. Pt departed post-treatment with no concerns or complaints. PHQ-9 on 4/4 was a 8.

## 2023-04-20 ENCOUNTER — Other Ambulatory Visit (HOSPITAL_COMMUNITY)

## 2023-04-20 DIAGNOSIS — F331 Major depressive disorder, recurrent, moderate: Secondary | ICD-10-CM | POA: Diagnosis not present

## 2023-04-20 NOTE — Progress Notes (Signed)
 Patient reported alone to [facility] for session #7 of Repetitive Transcranial Magnetic Stimulation treatment for episode of major depressive disorder. Patient presented with appropriate affect, level mood and denied any suicidal or homicidal ideations. Patient reported no change in alcohol/substance use, caffeine consumption, sleep pattern or metal implant status since previous tx.?Pt engaged in small talk during escort to [employer] outpatient room, asked TMS Tech about novel podcast that was referenced the week prior.   Pt's treatment parameters are as follows: A/P -- 17 cm, SOA -- 34?degrees, Coil Angle -- ?0?degrees, Motor Threshold -- 1.11?SMT.?With these parameters, the pt will receive 36 sessions of TMS according to the following protocol: 3000 pulses per session, with stimulation in bursts of pulses lasting 4 seconds at a frequency of 10 Hz, separated by 11 seconds of rest. TMS Tech reviewed charting notes from  4/3 and 4/4; unable to view sessions due to security restrictions. TMS Tech made titration decisions based on pt's recall from specific sessions, and charting notes from 04/15/23. Pt relayed feeling jaw movement/twitching with increased power but was unsure if normal. TMS Tech assured pt that face movement (eyes, eyebrow, jaw, etc) was normal but should never feel painful. [employer] Tech offered pt solutions to reduce potential discomfort on teeth (rubber mouthguard). Coil was moved to the treatment location, and the first burst of pulses was applied at a reduced power of 85%. No discomfort or complaints were reported by pt. Pt listened to the reccomended true crime podcast during tx. TMS Tech titrated pt up to 90% after 5 minutes. No sharp pain or discomfort was reported. Treatment power remained at 90% for 8 minutes of the session before TMS Tech increased power to 95% for remaining 5 minutes. Pt tolerated tx well. Upon conclusion of the session, no dizziness or  lightheadedness was reported by pt. After discussing appointment scheduling till 30th session, pt continued to small talk during escort back to lobby area. Pt departed post-treatment with no concerns or complaints

## 2023-04-21 ENCOUNTER — Other Ambulatory Visit (HOSPITAL_BASED_OUTPATIENT_CLINIC_OR_DEPARTMENT_OTHER)

## 2023-04-21 DIAGNOSIS — F331 Major depressive disorder, recurrent, moderate: Secondary | ICD-10-CM

## 2023-04-21 NOTE — Progress Notes (Signed)
 Patient reported alone to [facility] for session #8 of Repetitive Transcranial Magnetic Stimulation treatment for episode of major depressive disorder. Patient presented with appropriate affect, level mood and denied any suicidal or homicidal ideations. Patient reported no change in alcohol/substance use, caffeine consumption, sleep pattern or metal implant status since previous tx.   Pt's treatment parameters are as follows: A/P -- 17 cm, SOA -- 34?degrees, Coil Angle -- ?0?degrees, Motor Threshold -- 1.11?SMT.?With these parameters, the pt will receive 36 sessions of TMS according to the following protocol: 3000 pulses per session, with stimulation in bursts of pulses lasting 4 seconds at a frequency of 10 Hz, separated by 11 seconds of rest.  Coil was moved to the treatment location, but difficulty aligning coil to A/P bar was observed by [employer]. TMS Tech made adjustments to the head-cushion insert to angle pt's head forward, then unlocked the back part of machine and moved it closer to increase cord length. A/P bar was correct after adjustments. The first burst of pulses were applied at a reduced power of 90%. No discomfort or complaints were reported by pt. Pt listened to a podcast about [place]. TMS Tech titrated tx power to 95% after 4 minutes. Pt reported the pulses as tolerable and not painful. Treatment power was increased again to 100% for final 5 minutes. Jaw movement was observed at higher percentage. Slight pain responses were observed by TMS Tech (grimacing, deeper breaths, eye watering) but when asked pt directly, pt reported no pain. Upon conclusion of the session, no dizziness or lightheadedness was reported. Pt continued to small talk during escort back to lobby area. Pt departed post-treatment with no concerns or complaints

## 2023-04-22 ENCOUNTER — Encounter (HOSPITAL_COMMUNITY): Payer: Self-pay

## 2023-04-22 ENCOUNTER — Other Ambulatory Visit (HOSPITAL_COMMUNITY)

## 2023-04-23 ENCOUNTER — Other Ambulatory Visit (HOSPITAL_BASED_OUTPATIENT_CLINIC_OR_DEPARTMENT_OTHER)

## 2023-04-23 VITALS — BP 102/68 | HR 63 | Temp 97.7°F | Resp 18

## 2023-04-23 DIAGNOSIS — F331 Major depressive disorder, recurrent, moderate: Secondary | ICD-10-CM

## 2023-04-23 NOTE — Progress Notes (Signed)
 Patient reported alone to [facility] for session #9 of Repetitive Transcranial Magnetic Stimulation treatment for episode of major depressive disorder. Patient presented with appropriate affect, level mood and denied any suicidal or homicidal ideations. Patient reported no change in alcohol/substance use, caffeine consumption, sleep pattern or metal implant status since previous tx. Weekly vital signs and PHQ-9 screening was conducted, and reads as follows: BP=102/68 (sitting), pulse= 63, temp= 97.7, Respiratory rate= 18, PHQ= 12.   Pt's treatment parameters are as follows: A/P -- 17 cm, SOA -- 34?degrees, Coil Angle -- ?0?degrees, Motor Threshold -- 1.11?SMT.?With these parameters, the pt will receive 36 sessions of TMS according to the following protocol: 3000 pulses per session, with stimulation in bursts of pulses lasting 4 seconds at a frequency of 10 Hz, separated by 11 seconds of rest.  Coil was moved to the treatment location, but difficulty aligning coil to A/P bar was still observed by [employer] Tech. TMS Tech made adjustments to the head-cushion insert to angle pt's head forward, then unlocked the back part of machine and moved it closer to increase cord length. A/P bar was able to reach after adjustments, but remained at 16.7 cm instead of 17cm. The first burst of pulses were applied at a reduced power of 95%. No discomfort or complaints were reported by pt. Pt opted to sit in silence during tx. TMS Tech titrated tx power to 100% at 13 minutes, to ensure no sharp pain or additional discomfort was experienced. Pt confirmed that no pain was felt, but showed slight pain responses through tightening of the face and holding breath during pulse trains. Treatment power was increased to 105% for final 3 minutes. Jaw movement was observed at higher percentage. [employer] Tech asked pt again if discomfort or pain was felt, and pt reported no pain. Upon conclusion of the session, no  dizziness or lightheadedness was reported or observed. TMS Tech continued to small talk with pt during escort back to lobby area. Pt departed post-treatment with no concerns or complaints

## 2023-04-24 ENCOUNTER — Other Ambulatory Visit (HOSPITAL_BASED_OUTPATIENT_CLINIC_OR_DEPARTMENT_OTHER)

## 2023-04-24 DIAGNOSIS — F331 Major depressive disorder, recurrent, moderate: Secondary | ICD-10-CM | POA: Diagnosis not present

## 2023-04-24 NOTE — Progress Notes (Signed)
 Patient reported to [facility] for session #10 of Repetitive Transcranial Magnetic Stimulation treatment for episode of major depressive disorder. Patient presented with appropriate affect, level mood and denied any suicidal or homicidal ideations. Patient reported no change in alcohol/substance use, caffeine consumption, sleep pattern or metal implant status since previous tx. Pt expressed increased tiredness, but related it to the current weather conditions (rainy/cold).    Pt's treatment parameters are as follows: A/P -- 17 cm, SOA -- 34?degrees, Coil Angle -- ?0?degrees, Motor Threshold -- 1.11?SMT.?With these parameters, the pt will receive 36 sessions of TMS according to the following protocol: 3000 pulses per session, with stimulation in bursts of pulses lasting 4 seconds at a frequency of 10 Hz, separated by 11 seconds of rest. TMS Tech adjusted pt in the chair and explained how they were going to replicate positioning that was conducted during MT/1st appt. Replication should reduce coil and A/P bar issues. Pt agreed. No difficulty in aligning pt's measurements was observed by TMS Tech. Pt and TMS Tech small talked while Tech moved coil was moved to the treatment location. Pt then stated they were on the autism spectrum. TMS Tech did not see dx within chart but reported novel information to resident provider. ASD dx could explain pt's sensitivity to pulse trains. The first burst of pulses were applied at a reduced power of 95%. No discomfort or complaints were reported by pt. Pt listened to [facility] podcast during session. TMS Tech titrated tx power to 100% after 2 minutes to increase toleration to higher power. Pt confirmed no discomfort felt. Treatment power was increased to 105% after 8 minutes. Pt exhibited increased toleration through deeper breaths and a more relaxed posture in tx chair than during previous sessions. Treatment percentage was titrated to 110% for  the final 5 minutes of the session. Pt continued to tolerate tx well. Upon conclusion of the session, no dizziness or lightheadedness was reported or observed. TMS Tech continued to small talk with pt during escort back to lobby area. Pt departed post-treatment with no concerns or complaints

## 2023-04-27 ENCOUNTER — Ambulatory Visit (INDEPENDENT_AMBULATORY_CARE_PROVIDER_SITE_OTHER): Payer: Self-pay | Admitting: Clinical

## 2023-04-27 ENCOUNTER — Other Ambulatory Visit (HOSPITAL_BASED_OUTPATIENT_CLINIC_OR_DEPARTMENT_OTHER)

## 2023-04-27 VITALS — BP 107/77 | HR 68 | Temp 97.9°F | Resp 16

## 2023-04-27 DIAGNOSIS — F341 Dysthymic disorder: Secondary | ICD-10-CM

## 2023-04-27 DIAGNOSIS — F411 Generalized anxiety disorder: Secondary | ICD-10-CM

## 2023-04-27 DIAGNOSIS — F331 Major depressive disorder, recurrent, moderate: Secondary | ICD-10-CM | POA: Diagnosis not present

## 2023-04-27 NOTE — Progress Notes (Signed)
 Patient reported to [facility] for session #11 of Repetitive Transcranial Magnetic Stimulation treatment for episode of major depressive disorder. Patient presented with appropriate affect, level mood and denied any suicidal or homicidal ideations. Patient reported no change in alcohol/substance use, caffeine consumption, sleep pattern or metal implant status since previous tx. Weekly vital signs and PHQ-9 screening was collected by TMS Tech. Vitals read as follows: BP= 107/77, pulse= 68, temperature= 97.9, respiratory rate= 16. Pt scored a 15 on the PHQ-9 screening. Increase in score may be related to a psychotherapy appt that occurred one hour before TMS session.   Pt's treatment parameters are as follows: A/P -- 17 cm, SOA -- 34?degrees, Coil Angle -- ?0?degrees, Motor Threshold -- 1.11?SMT.?With these parameters, the pt will receive 36 sessions of TMS according to the following protocol: 3000 pulses per session, with stimulation in bursts of pulses lasting 4 seconds at a frequency of 10 Hz, separated by 11 seconds of rest. TMS Tech adjusted pt in the chair and relayed titration schedule. Pt agreed. Treatment coil was moved and the first burst of pulses were applied at a reduced power of 95%. No discomfort or complaints were reported by pt. Pt listened to several podcasts during the session. TMS Tech titrated tx power to 100% after 4 minutes to increase toleration to higher power. Pt confirmed no pain was felt, but slight discomfort was observed by TMS Tech through tightly closed eyes and slight redness of the face. Treatment power remained at 100% for 6 minutes of the session before increasing to 105%. Pt reported no pain or discomfort when asked by TMS Tech. Pt remained at 105% for the final 9 minutes of the session to increase acclimation. Upon conclusion of the session, no dizziness or lightheadedness was reported or observed. [employer] Tech informed pt that they will try  to reach 110% for next session, as 120% is ideal treatment goal. Pt agreed. Pt departed post-treatment with no concerns or complaints
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
## 2023-04-28 ENCOUNTER — Other Ambulatory Visit (HOSPITAL_BASED_OUTPATIENT_CLINIC_OR_DEPARTMENT_OTHER)

## 2023-04-28 DIAGNOSIS — F331 Major depressive disorder, recurrent, moderate: Secondary | ICD-10-CM

## 2023-04-28 NOTE — Progress Notes (Signed)
 Patient reported to [facility] for session #12 of Repetitive Transcranial Magnetic Stimulation treatment for episode of major depressive disorder. Patient presented with appropriate affect, level mood and denied any suicidal or homicidal ideations. Patient reported no change in alcohol/substance use, caffeine consumption, sleep pattern or metal implant status since previous tx.    Pt's treatment parameters are as follows: A/P -- 17 cm, SOA -- 34?degrees, Coil Angle -- ?0?degrees, Motor Threshold -- 1.11?SMT.?With these parameters, the pt will receive 36 sessions of TMS according to the following protocol: 3000 pulses per session, with stimulation in bursts of pulses lasting 4 seconds at a frequency of 10 Hz, separated by 11 seconds of rest. TMS Tech adjusted pt in the chair and relayed titration schedule. Pt agreed. Treatment coil was moved and the first burst of pulses were applied at a reduced power of 105%. No discomfort or complaints were reported by pt. Pt listened to podcasts during the session. TMS Tech titrated tx power to 110% at the 12 minute mark. Pt reported no pain, involuntary twitching in hands/legs/feet, or general discomfort felt. TMS Tech observed increased/faster breath pattern during active pulse trains, but no tightening of the eyes or other pain-type responses observed. Treatment power remained at 110% for 7 minutes of the session before TMS Tech titrated tx power to 115% for remaining 4 minutes. No discomfort reported. Upon conclusion of the session, no dizziness or lightheadedness was reported by pt or observed by TMS Tech. When asked, pt reported 115% as feeling "intense but tolerable." Pt departed post-treatment with no concerns or complaints

## 2023-04-29 ENCOUNTER — Other Ambulatory Visit (HOSPITAL_BASED_OUTPATIENT_CLINIC_OR_DEPARTMENT_OTHER)

## 2023-04-29 DIAGNOSIS — F331 Major depressive disorder, recurrent, moderate: Secondary | ICD-10-CM | POA: Diagnosis not present

## 2023-04-29 NOTE — Progress Notes (Signed)
 Patient reported to [facility] for session #13 of Repetitive Transcranial Magnetic Stimulation treatment for episode of major depressive disorder. Patient presented with appropriate affect, level mood and denied any suicidal or homicidal ideations. Patient reported no change in alcohol/substance use, caffeine consumption, sleep pattern or metal implant status since previous tx.    Pt's treatment parameters are as follows: A/P -- 17 cm, SOA -- 34?degrees, Coil Angle -- ?0?degrees, Motor Threshold -- 1.11?SMT.?With these parameters, the pt will receive 36 sessions of TMS according to the following protocol: 3000 pulses per session, with stimulation in bursts of pulses lasting 4 seconds at a frequency of 10 Hz, separated by 11 seconds of rest. TMS Tech adjusted pt in the chair and relayed titration schedule. Pt agreed. Treatment coil was moved and the first burst of pulses were applied at a reduced power of 105%. No discomfort or complaints were reported by pt. Pt listened to podcasts during the session. TMS Tech titrated tx power to 110% at the after 3 minutes. No discomfort or pain reported. Treatment power remained at 110% for 6 minutes of the session before tx power was increased to 115%. Pt affirmed again that no general discomfort or sharp pain was felt. [employer] Tech asked pt if they would like to remain at 115% for the remaining session time, or increase to 120% and see how they feel. Pt agreed to increase to full tx percentage. Pt stayed at 120% for last 3 minutes of the session. Pt tolerated tx well. Upon conclusion, Pt stated that full treatment power did not feel aversive, "just more intense." Pt confirmed no sharp pain or discomfort felt. During escort to main lobby area, no dizziness or lightheadedness was reported by pt or observed by [employer] Tech. Pt departed post-treatment with no concerns or complaints

## 2023-04-30 ENCOUNTER — Other Ambulatory Visit (HOSPITAL_BASED_OUTPATIENT_CLINIC_OR_DEPARTMENT_OTHER)

## 2023-04-30 DIAGNOSIS — F331 Major depressive disorder, recurrent, moderate: Secondary | ICD-10-CM

## 2023-04-30 NOTE — Progress Notes (Signed)
 Patient reported to [facility] for session #14 of Repetitive Transcranial Magnetic Stimulation treatment for episode of major depressive disorder. Patient presented with appropriate affect, level mood and denied any suicidal or homicidal ideations. Patient reported no change in alcohol/substance use, caffeine consumption, sleep pattern or metal implant status since previous tx.    Pt's treatment parameters are as follows: A/P -- 17 cm, SOA -- 34?degrees, Coil Angle -- ?0?degrees, Motor Threshold -- 1.11?SMT.?With these parameters, the pt will receive 36 sessions of TMS according to the following protocol: 3000 pulses per session, with stimulation in bursts of pulses lasting 4 seconds at a frequency of 10 Hz, separated by 11 seconds of rest. TMS Tech adjusted pt in the chair and relayed titration schedule. Pt agreed. Treatment coil was moved and the first burst of pulses were applied at a reduced power of 105%. No discomfort or complaints were reported by pt.  TMS Tech then titrated tx power to 110% at the after 5 minutes. No discomfort or pain reported. TMS Tech observed increased difficulty in coil staying connected, which resulted in several pauses during the session. TMS Tech placed additional pressure on coil to reduce additional pauses. Pt remained agreeable and exhibited patience with adjustments. Due to the multiple pauses in active session time, treatment power remained at 110% for majority of the session to increase acclimation. TMS Tech titrated tx power to 115% for the final 6 minutes of session. Pt affirmed again that no general discomfort was felt, but involuntary twitching was felt in their right hand. TMS Tech adjusted A/P bar forward by one point, reflecting 16.9 cm position. Twitching was still experienced, but at a decreased intensity. A/P bar was moved forward again to reflect 16.8cm. Pt continued to feel twitching in their right hand but stated "It's  fine because it doesn't hurt or anything." TMS Tech decreased tx power to 110% during final 2 minutes to be cautious of motor strip. Upon conclusion of the session, TMS Tech informed pt that they would relay session data/issues to resident and attending provider to discuss potential solutions. Pt was informed that a re-mapping of SMT/coil placement may have to be conducted, as twitching should not be felt during active tx. Pt was agreeable to plan and will await provider update. During escort to main lobby area, no dizziness or lightheadedness was reported by pt or observed by [employer] Tech. Pt departed post-treatment with no concerns or complaints.  Resident and Attending provider acknowledged session update and confirmed that remapping is not currently needed, but will re-evaluate after next PHQ screening. Pt is also fine to remain between 110%-115% tx prescription due to hypersensitivity and unique measurements (head cushion insert, memory foam pad, etc).

## 2023-05-01 ENCOUNTER — Other Ambulatory Visit (HOSPITAL_COMMUNITY)

## 2023-05-04 ENCOUNTER — Other Ambulatory Visit (HOSPITAL_BASED_OUTPATIENT_CLINIC_OR_DEPARTMENT_OTHER)

## 2023-05-04 VITALS — BP 107/70 | HR 64 | Temp 97.9°F | Resp 16

## 2023-05-04 DIAGNOSIS — F331 Major depressive disorder, recurrent, moderate: Secondary | ICD-10-CM | POA: Diagnosis not present

## 2023-05-04 NOTE — Progress Notes (Signed)
 Patient reported to [facility] for session #15 of Repetitive Transcranial Magnetic Stimulation treatment for episode of major depressive disorder. Patient presented with appropriate affect, level mood and denied any suicidal or homicidal ideations. Patient reported no change in alcohol/substance use, caffeine consumption, sleep pattern or metal implant status since previous tx. Weekly vital signs and PHQ-9 screening were obtained at the beginning of session and read as follows: BP= 107/70 (sitting), pulse= 64, temp= 97.9, respiratory rate= 16, PHQ= 9. No abnormal symptoms were reported by the pt or observed by TMS Tech.   Pt's treatment parameters are as follows: A/P -- 17 cm, SOA -- 34?degrees, Coil Angle -- ?0?degrees, Motor Threshold -- 1.11?SMT.?With these parameters, the pt will receive 36 sessions of TMS according to the following protocol: 3000 pulses per session, with stimulation in bursts of pulses lasting 4 seconds at a frequency of 10 Hz, separated by 11 seconds of rest. TMS Tech adjusted pt in the chair and relayed titration schedule, along with TMS provider update (tx prescription can remain between 110%-115% due to hypersensitivity and unique chair measurements). Pt agreed to titration schedule and tx update. The treatment coil was moved and the first burst of pulses were applied at a reduced power of 100%, due to three-day break. No discomfort or complaints were reported by pt. TMS Tech then titrated tx power to 105% after 2 minutes. No discomfort or pain reported. No issues with coil connection were presented during the session. Pt listened to a podcast during the session. Treatment power was titrated to 110% after 10 minutes. Pt reported no discomfort, sharp pain, or twitching in hands or feet. TMS Tech titrated tx power up to 115% for the final 5 minutes of the session. Pt tolerated tx well. Pt reported pulse trains as "still intense, but that's not a bad  thing." Upon conclusion of the session, Pt and TMS Tech discussed the revision of weekly appt schedule, at the pt's request. Pt and TMS Tech were agreeable to new appt times. During escort to main lobby area, no dizziness or lightheadedness was reported by pt or observed by [employer] Tech. Pt departed post-treatment with no concerns or complaints.

## 2023-05-05 ENCOUNTER — Other Ambulatory Visit (HOSPITAL_BASED_OUTPATIENT_CLINIC_OR_DEPARTMENT_OTHER)

## 2023-05-05 DIAGNOSIS — F331 Major depressive disorder, recurrent, moderate: Secondary | ICD-10-CM

## 2023-05-05 NOTE — Progress Notes (Signed)
 Patient reported to [facility] for session #16 of Repetitive Transcranial Magnetic Stimulation treatment for episode of major depressive disorder. Patient presented with appropriate affect, level mood and denied any suicidal or homicidal ideations. Patient reported no change in alcohol/substance use, caffeine consumption, sleep pattern or metal implant status since previous tx.    Pt's treatment parameters are as follows: A/P -- 17 cm, SOA -- 34?degrees, Coil Angle -- ?0?degrees, Motor Threshold -- 1.11?SMT.?With these parameters, the pt will receive 36 sessions of TMS according to the following protocol: 3000 pulses per session, with stimulation in bursts of pulses lasting 4 seconds at a frequency of 10 Hz, separated by 11 seconds of rest. TMS Tech adjusted pt in the chair and relayed titration schedule. Pt agreed to titration schedule. The treatment coil was moved and the first burst of pulses were applied at a reduced power of 105%. No discomfort or complaints were reported by pt. TMS Tech then titrated tx power to 110% after 4 minutes. No discomfort or pain reported. No issues with coil connection were presented during the session. Pt listened to a podcast during the session. Treatment power was titrated to 115% after 7 minutes and remained there until the end of session. Pt reported no discomfort, sharp pain, or twitching in hands or feet. Pt tolerated tx well. During escort to main lobby area, no dizziness or lightheadedness was reported by pt or observed by [employer] Tech. Pt departed post-treatment with no concerns or complaints.

## 2023-05-06 ENCOUNTER — Other Ambulatory Visit (HOSPITAL_BASED_OUTPATIENT_CLINIC_OR_DEPARTMENT_OTHER)

## 2023-05-06 DIAGNOSIS — F331 Major depressive disorder, recurrent, moderate: Secondary | ICD-10-CM

## 2023-05-06 NOTE — Progress Notes (Signed)
 Patient reported to [facility] for session #17 of Repetitive Transcranial Magnetic Stimulation treatment for episode of major depressive disorder. Patient presented with appropriate affect, level mood and denied any suicidal or homicidal ideations. Patient reported no change in alcohol/substance use, caffeine consumption, sleep pattern or metal implant status since previous tx.    Pt's treatment parameters are as follows: A/P -- 17 cm, SOA -- 34?degrees, Coil Angle -- ?0?degrees, Motor Threshold -- 1.11?SMT.?With these parameters, the pt will receive 36 sessions of TMS according to the following protocol: 3000 pulses per session, with stimulation in bursts of pulses lasting 4 seconds at a frequency of 10 Hz, separated by 11 seconds of rest. TMS Tech adjusted pt in the chair and relayed titration schedule. Pt agreed to plan. The treatment coil was moved and the first burst of pulses were applied at a reduced power of 105%. No discomfort or complaints were reported by pt. Treatment power stayed at 105% for 3 minutes before increasing to 110%. No discomfort or pain was reported by the pt. Slight issues with coil connection were presented during the session, but did not require additional adjustments. Pt listened to a podcast during the session. Treatment power remained at 110% for 6 minutes, then increased to 115%. Pt reported no discomfort, sharp pain, or twitching in hands or feet. Pt stayed at 115% for the remaining duration of session (9 minutes). During escort to main lobby area, no dizziness or lightheadedness was reported by pt or observed by [employer] Tech. Pt departed post-treatment with no concerns or complaints.

## 2023-05-07 ENCOUNTER — Other Ambulatory Visit (HOSPITAL_BASED_OUTPATIENT_CLINIC_OR_DEPARTMENT_OTHER)

## 2023-05-07 DIAGNOSIS — F331 Major depressive disorder, recurrent, moderate: Secondary | ICD-10-CM | POA: Diagnosis not present

## 2023-05-07 NOTE — Progress Notes (Signed)
 Patient reported to [facility] for session #18 of Repetitive Transcranial Magnetic Stimulation treatment for episode of major depressive disorder. Patient presented with appropriate affect, level mood and denied any suicidal or homicidal ideations. Patient reported no change in alcohol/substance use, caffeine consumption, sleep pattern or metal implant status since previous tx. 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 Patient requested if appointment could occur in silence, and explained that silence helps regulate increased anxiety levels. [employer] Tech agreed.    Pt's treatment parameters are as follows: A/P -- 17 cm, SOA -- 34?degrees, Coil Angle -- ?0?degrees, Motor Threshold -- 1.11?SMT.?With these parameters, the pt will receive 36 sessions of TMS according to the following protocol: 3000 pulses per session, with stimulation in bursts of pulses lasting 4 seconds at a frequency of 10 Hz, separated by 11 seconds of rest. TMS Tech adjusted pt in the chair and relayed titration schedule. Pt agreed to plan. The treatment coil was moved and the first burst of pulses were applied at a reduced power of 105%. No discomfort or complaints were reported by pt. Treatment power stayed at 105% for 2 minutes before increasing to 110%. No discomfort or pain was reported by the pt. Pt tolerated treatment and multiple titrations well. The treatment power level remained at 110% for 7 minutes, then increased to 115%. Pt reported no discomfort, sharp pain, or twitching in hands or feet. Pt stayed at 115% for the remaining duration of session (9 minutes). During escort to main lobby area, no dizziness or lightheadedness was reported by pt or observed by [employer] Tech. Pt departed post-treatment with no concerns or complaints.

## 2023-05-08 ENCOUNTER — Other Ambulatory Visit (HOSPITAL_BASED_OUTPATIENT_CLINIC_OR_DEPARTMENT_OTHER)

## 2023-05-08 DIAGNOSIS — F331 Major depressive disorder, recurrent, moderate: Secondary | ICD-10-CM | POA: Diagnosis not present

## 2023-05-08 NOTE — Progress Notes (Signed)
 Patient reported to [facility] for session #19 of Repetitive Transcranial Magnetic Stimulation treatment for episode of major depressive disorder. Patient presented with appropriate affect, greeted TMS Tech with a smile, level mood and denied any suicidal or homicidal ideations. Patient reported no change in alcohol/substance use, caffeine consumption, sleep pattern or metal implant status since previous tx.    Pt's treatment parameters are as follows: A/P -- 17 cm, SOA -- 34?degrees, Coil Angle -- ?0?degrees, Motor Threshold -- 1.11?SMT.?With these parameters, the pt will receive 36 sessions of TMS according to the following protocol: 3000 pulses per session, with stimulation in bursts of pulses lasting 4 seconds at a frequency of 10 Hz, separated by 11 seconds of rest. TMS Tech adjusted pt in the chair and relayed titration schedule. Pt agreed to plan. The treatment coil was moved and the first burst of pulses were applied at a reduced power of 105%. No discomfort or complaints were reported by pt. Treatment power stayed at 105% for 2 minutes before increasing to 110%. No discomfort or pain was reported by the pt. Pt listened to podcasts during the session. The treatment power level remained at 110% for 5 minutes, then increased to 115%. Pt reported no discomfort, sharp pain, or twitching in hands or feet. Pt exhibited a pattern of hand movements while receiving the higher power level. TMS Tech paused the session and asked pt if movements were involuntary or if they were practicing finger scales. Pt confirmed that movements were intentional. TMS Tech resumed the session, and pt stayed at 115% for the remaining duration of session. Pt tolerated treatment well. During escort to main lobby area, no dizziness or lightheadedness was reported by pt or observed by [employer] Tech. Pt departed post-treatment with no concerns or complaints.

## 2023-05-11 ENCOUNTER — Other Ambulatory Visit (HOSPITAL_BASED_OUTPATIENT_CLINIC_OR_DEPARTMENT_OTHER)

## 2023-05-11 ENCOUNTER — Ambulatory Visit (INDEPENDENT_AMBULATORY_CARE_PROVIDER_SITE_OTHER): Payer: Self-pay | Admitting: Clinical

## 2023-05-11 VITALS — BP 111/78 | HR 71 | Temp 98.2°F | Resp 16

## 2023-05-11 DIAGNOSIS — F411 Generalized anxiety disorder: Secondary | ICD-10-CM

## 2023-05-11 DIAGNOSIS — F331 Major depressive disorder, recurrent, moderate: Secondary | ICD-10-CM

## 2023-05-11 DIAGNOSIS — F341 Dysthymic disorder: Secondary | ICD-10-CM

## 2023-05-11 NOTE — Progress Notes (Signed)
 Patient reported to [facility] for session #20 of Repetitive Transcranial Magnetic Stimulation treatment for episode of major depressive disorder. Patient presented with a quieter but still appropriate affect, level mood, and denied any suicidal or homicidal ideations. Patient reported no change in alcohol/substance use, caffeine consumption, sleep pattern or metal implant status since previous tx. Weekly vital signs and PHQ-9 screening was given prior to starting the session. Vitals read as follows: BP=111/78 (sitting), pulse= 71, temperature= 98.2, respiratory rate= 16, PHQ= 11. Pt stated they felt like they "haven't woken up for the day yet, despite exercising early AM (swimming) and attending a counseling appt" prior to the TMS session. Tiredness and psychotherapy may be related to increased screening score.   Pt's treatment parameters are as follows: A/P -- 17 cm, SOA -- 34?degrees, Coil Angle -- ?0?degrees, Motor Threshold -- 1.11?SMT.?With these parameters, the pt will receive 36 sessions of TMS according to the following protocol: 3000 pulses per session, with stimulation in bursts of pulses lasting 4 seconds at a frequency of 10 Hz, separated by 11 seconds of rest. TMS Tech adjusted pt in the chair and relayed titration schedule. Pt agreed to plan. The treatment coil was moved and the first burst of pulses were applied at a reduced power of 105%. No discomfort or complaints were reported by pt. Treatment power stayed at 105% for 2 minutes before increasing to 110%. No discomfort or pain was reported by the pt. Pt requested silence for this session, TMS tech agreed. The treatment power level remained at 110% for 7 minutes, then increased to 115%. Pt reported no discomfort, sharp pain, or twitching in hands or feet. Pt stayed at 115% for the remaining duration of session (9 minutes). Pt tolerated treatment well. During escort to main lobby area, no dizziness or  lightheadedness was reported by pt or observed by [employer] Tech. Pt departed post-treatment with no concerns or complaints.

## 2023-05-11 NOTE — Progress Notes (Signed)
 Time: 9:00 am-9:54 am CPT Code: 16109U Diagnosis Code: F41.1, F34.1  Jonathan [name] was seen in person for individual therapy. He reported that his mood has remained stable and he continues to attend TMS treatment. He is unsure of whether he sees a benefit yet but is committed to seeing it through. Session focused on trying a looser approach to his preparation for gigs, which he plans to take on more of. He created a plan to try to approach it with a loose attitude, and therapist suggested incorporating his mindfulness practice. He is scheduled to be seen again in two weeks.   Coping plan I commit to: Regular runs (every other day) Biking or swimming on days when not running Daily meditation Giving problems over to a higher power Think of three things I am grateful for every day One small positive step daily Focused listening to music daily Take medication as prescribed  Call referral options (TMS treatment, partial hospitalization, ketamine treatment, DBT group) Go to emergency room  Reach out to therapist (NOT IN AN EMERGENCY-in emergency I will go to ER)     Intake  Presenting Problem  Jonathan [name] shared that he has been in therapy since Jun 03, 1983. He has been working with Dr. Fidencio Hue for the past 15 years. Recently, he has been working to manage anxiety, perfectionism, grief over the loss of his parents, and issues in relationships. 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 His most recent episode of catatonic depression was in 2013-2016. 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 He and his wife moved to [place] in Jun 02, 2001. He has been teaching at [employer] since 06-02-01, and his wife obtained a doctorate in clarinette. His wife teaches Engineering geologist and information studies at [employer]. Symptoms  Difficulty sleeping, perfectionism, difficulty coping when situations do not meet expectations, tendency toward cognitive rigidity, recurrent  major depression including depressed mood that have occurred periodically since Ladarrion's early childhood. These have sometimes included passive suicidal ideation which on one occasion included a plan while Cayson was in graduate school. His plan at the time was to overdose on sleeping pills. He was able to call suicide prevention and did not act on these thoughts. He described the potential impact on his family as a major barrier to acting on suicidal ideation. Family of Origin   Deforrest's mother died in 06-03-14 at the age of 25. He described her mental health while alive as "horrendous," and shared suspicion that she may have experienced childhood abuse that resulted in her being sent away to a boarding school. Emauri's maternal uncle was schizophrenic and institutionalized for much of his life. [place] and his brother obtained power of attorney toward the end of her life. Toward the end of her life, she became convinced that a variety of inanimate objects were sending her message. Kingsley has a brother who is three years younger. Wilborn's father passed away in 06-03-2019. He described a good relationship with his brother and nephews.    No needs/concerns related to ethnicity reported when asked: Dezmund reported that three of his four grandparents were [name] immigrants. Much of his upbringing was focused on [name] culture. He described himself as "recovering" from the [name]. He has taken Buddhist vows.    Medical Issues Adit sees Dr. Viveca Grist for medication management. He is prescribed buproprion, citalopram. Sleep aids as needed. He described himself as healthy overall, with minimal medical issues.  Leisure Activities/Daily  Functioning   Tiger enjoys crossword puzzles, reading, movies, cooking, walking outside, listening to music. He has not spent as much time engaged in his leisure activities since the semester started at [employer].   Substance use Hutchison reported that he does not drink alcohol during the semester,  but occasionally has a glass of wine or beer over the summer. He does not smoke.  Legal Status   No Legal Problems  Other   General Behavior: cooperative  Attire: appropriate  Gait: normal  Motor Activity: normal  Stream of Thought - Productivity: spontaneous  Stream of thought - Progression: normal  Stream of thought - Language: normal  Emotional tone and reactions - Mood: normal  Emotional tone and reactions - Affect: appropriate  Mental trend/Content of thoughts - Perception: normal  Mental trend/Content of thoughts - Orientation: normal  Mental trend/Content of thoughts - Memory: normal  Mental trend/Content of thoughts - General knowledge: consistent with education  Insight: good  Judgment: good  Intelligence: average  Diagnostic Summary   Dysthmia, Generalized Anxiety Disorder         Treatment Plan Client Abilities/Strengths  Natrone is open to feedback in a therapeutic setting and has found constructive criticism helpful in the past. He has been engaged and motivated in therapy for several years.   Client Treatment Preferences:  He is open to virtual and in-person appointments.  Client Statement of Needs  Zalmen is seeking therapy for emotional support and stress reduction in his relationship and day-to-day life.  Treatment Level   Ilian typically benefits from therapy weekly to every two months, depending on the state of his mental health. Symptoms  Perfectionistic tendencies, occasional depressive episodes, anxiety related to a range of situations Problems Addressed  Goals 1. Averey would like to develop strategies and gain support in order to prevent severe depressive episodes.  Objective  Target Date: 09/27/2023 Frequency: 2 weeks  Progress: 50% Modality: Individual therapy  Related Interventions Dent will be provided opportunities to discuss his experiences in session. Therapist will use CBT based strategies to help Joevanny identify and disengage from maladaptive thoughts  and behaviors. Therapist will incorporate behavior activation strategies as appropriate. Therapist will provide emotion regulation strategies, including meditation, mindfulness, and self-care, as appropriate.  Therapist will provide referrals for additional resources as appropriate.   Related Interventions Diagnosis Axis none  Dysthmia, generalized anxiety disorder   Axis none    Conditions For Discharge Achievement of treatment goals and objectives   Arlene Lacy, PhD               Arlene Lacy, PhD

## 2023-05-12 ENCOUNTER — Other Ambulatory Visit (HOSPITAL_BASED_OUTPATIENT_CLINIC_OR_DEPARTMENT_OTHER)

## 2023-05-12 DIAGNOSIS — F331 Major depressive disorder, recurrent, moderate: Secondary | ICD-10-CM

## 2023-05-12 NOTE — Progress Notes (Signed)
 Patient reported to [facility] for session #21 of Repetitive Transcranial Magnetic Stimulation treatment for episode of major depressive disorder. Patient presented with an appropriate affect, level mood, and denied any suicidal or homicidal ideations. Patient reported no change in alcohol/substance use, caffeine consumption, sleep pattern or metal implant status since previous tx. Pt was given a Beck's Depression Inventory (BDI) to assess score/tx progress. Pt's BDI score was a 20, which shows improvement from original BDI score (27).   Pt's treatment parameters are as follows: A/P -- 17 cm, SOA -- 34?degrees, Coil Angle -- ?0?degrees, Motor Threshold -- 1.11?SMT.?With these parameters, the pt will receive 36 sessions of TMS according to the following protocol: 3000 pulses per session, with stimulation in bursts of pulses lasting 4 seconds at a frequency of 10 Hz, separated by 11 seconds of rest. TMS Tech adjusted pt in the chair and relayed titration schedule. Pt agreed to plan. The treatment coil was moved and the first burst of pulses were applied at a reduced power of 105%. No discomfort or complaints were reported by pt. Treatment power stayed at 105% for 2 minutes before increasing to 110%. No discomfort or pain was reported by the pt. Pt requested silence for this session, TMS tech agreed. Difficulty with coil keeping connection to pt's scalp was presented during session, resulting in multiple pauses. TMS Tech rectified connection issues by placing additional pressure on tx coil. The treatment power level remained at 110% for 5.5 minutes, then increased to 115%. Pt reported no discomfort, sharp pain, or twitching in hands or feet. Pt stayed at 115% for the remaining duration of session (11 minutes). During escort to main lobby area, no dizziness or lightheadedness was reported by pt or observed by [employer] Tech. Pt departed post-treatment with no concerns or  complaints.

## 2023-05-13 ENCOUNTER — Telehealth (HOSPITAL_COMMUNITY): Payer: Self-pay

## 2023-05-13 ENCOUNTER — Other Ambulatory Visit (HOSPITAL_BASED_OUTPATIENT_CLINIC_OR_DEPARTMENT_OTHER)

## 2023-05-13 DIAGNOSIS — F331 Major depressive disorder, recurrent, moderate: Secondary | ICD-10-CM | POA: Diagnosis not present

## 2023-05-13 NOTE — Progress Notes (Signed)
 Patient reported to [facility] for session #22 of Repetitive Transcranial Magnetic Stimulation treatment for episode of major depressive disorder. Patient presented with an appropriate affect, level mood, and denied any suicidal or homicidal ideations. Patient reported no change in alcohol/substance use, caffeine consumption, sleep pattern or metal implant status since previous tx.   Pt's treatment parameters are as follows: A/P -- 17 cm, SOA -- 34?degrees, Coil Angle -- ?0?degrees, Motor Threshold -- 1.11?SMT.?With these parameters, the pt will receive 36 sessions of TMS according to the following protocol: 3000 pulses per session, with stimulation in bursts of pulses lasting 4 seconds at a frequency of 10 Hz, separated by 11 seconds of rest. TMS Tech adjusted pt in the chair and relayed titration schedule. Pt agreed to plan. The treatment coil was moved and the first burst of pulses were applied at a reduced power of 105%. No discomfort or complaints were reported by pt. Treatment power stayed at 105% for 1 minute after initial titration before TMS Tech increased power to 110%. No discomfort or pain was reported by the pt. Pt listened to podcasts during the session. The treatment power was then increased to 115% after verbally confirming with pt. Pt reported no discomfort, sharp pain, or twitching in hands or feet. Pt stayed at 115% for the remaining duration of session (13 minutes). Upon conclusion, Pt and TMS Tech discussed tapered sessions schedule before exiting tx room. During escort to main lobby area, no dizziness or lightheadedness was reported by pt or observed by [employer] Tech. Pt departed post-treatment with no concerns or complaints.

## 2023-05-13 NOTE — Telephone Encounter (Signed)
 [employer] Coordinator placed outgoing call to pt's insurance Administrator) to check prior authorization end date, as pt is approaching last 6 sessions. Coordinator spoke with Representative Adah Hollering. Call ref #[id number]. Rep stated that prior authorization was canceled and a new request would have to be submitted. Coordinator provided information of approved auth (call given on 03/25/23) and requested to speak to a supervisor. After waiting on hold, supervisor Melodie Spry S) stated that prior [employer] was canceled due to an "error," but could not explain what the error was. Coordinator re-emphasized call records and voicemail confirming approval. Coordinator informed supervisor of no communication being given about canceled auth and that pt has almost completed the full course of tx. Supervisor placed Coordinator back on hold for 15 minutes before attempting to transfer to [facility] dept. Supervisor then ended the call. Coordinator called back and spoke with Lindaann Requena, who transferred Coordinator directly to pre-cert dept. Coordinator spoke with Shelagh Derrick, who stated the error was two different prior auths were submitted wit same info included, and can use the ref number of 2nd auth for claims. Coordinator denied d/t potential of fraudulent billing and demanded supervisor. Supervisor provided additional information and confirmed that pt's authorization is approved and will not present issue later on. Supervisor will fax all information to Coordinator directly.

## 2023-05-14 ENCOUNTER — Other Ambulatory Visit (HOSPITAL_COMMUNITY): Attending: Psychiatry

## 2023-05-14 ENCOUNTER — Telehealth (HOSPITAL_COMMUNITY): Payer: Self-pay

## 2023-05-14 DIAGNOSIS — F331 Major depressive disorder, recurrent, moderate: Secondary | ICD-10-CM | POA: Insufficient documentation

## 2023-05-14 NOTE — Telephone Encounter (Signed)
 [employer] Coordinator placed outgoing call again to pt's payer [facility]) to correct information on re-submitted prior authorization [id number]). Coordinator spoke with CSR Nica M, was then transferred to Ged R ([place] pre-cert line), and transferred again to [facility]. Coordinator requested supervisor or case manager due to several errors being made on PA. The facility name was incorrectly listed, along with amount of requested units for CPT [phone]. Coordinator emphasized medical policy found on payer's website that was incorrectly stated on 05/13/23. Coordinator was told only 34 units would be approved instead of 36. Medical polcy confirms 36 sessions. CSR Sam M updated facility name/location, and provided clinician number [phone]). Coordinator was then disconnected from call. Coordinator called clinician's line directly and was sent to vm. Coordinator left vm requested urgent call back.

## 2023-05-14 NOTE — Progress Notes (Signed)
 Patient reported to [facility] for session #23 of Repetitive Transcranial Magnetic Stimulation treatment for episode of major depressive disorder. Patient presented with an appropriate affect, level mood, and denied any suicidal or homicidal ideations. Patient reported no change in alcohol/substance use, caffeine consumption, sleep pattern or metal implant status since previous tx. [employer] Tech informed pt of prior authorization error, due to [employer] canceling pt's PA request. [employer] Tech assured pt that error will be rectified and should not result in any denials or unpaid sessions. Pt was provided [employer] and CPT information in case pt needed/wanted to call payer on their own accord. Pt was receptive to information.   Pt's treatment parameters are as follows: A/P -- 17 cm, SOA -- 34?degrees, Coil Angle -- ?0?degrees, Motor Threshold -- 1.11?SMT.?With these parameters, the pt will receive 36 sessions of TMS according to the following protocol: 3000 pulses per session, with stimulation in bursts of pulses lasting 4 seconds at a frequency of 10 Hz, separated by 11 seconds of rest. TMS Tech adjusted pt in the chair and relayed titration schedule. Pt agreed to plan. The treatment coil was moved and the first burst of pulses were applied at a reduced power of 105%. No discomfort or complaints were reported by pt. Treatment power stayed at 105% for 1 minute after initial titration before TMS Tech increased power to 110%. No discomfort or pain was reported by the pt. Pt requested silence during session time, TMS Tech agreed. The treatment power was then increased to 115% after verbally confirming toleration with pt. Pt reported no discomfort, sharp pain, or twitching in hands or feet. Pt stayed at 115% for the remaining duration of session (14 minutes). During escort to main lobby area, no dizziness or lightheadedness was reported by pt or observed by [employer] Tech. Pt departed  post-treatment with no concerns or complaints.

## 2023-05-15 ENCOUNTER — Other Ambulatory Visit (HOSPITAL_BASED_OUTPATIENT_CLINIC_OR_DEPARTMENT_OTHER)

## 2023-05-15 DIAGNOSIS — F331 Major depressive disorder, recurrent, moderate: Secondary | ICD-10-CM | POA: Diagnosis not present

## 2023-05-15 NOTE — Progress Notes (Signed)
 Patient reported to [facility] for session #24 of Repetitive Transcranial Magnetic Stimulation treatment for episode of major depressive disorder. Patient presented with an appropriate affect, level mood, and denied any suicidal or homicidal ideations. Patient reported no change in alcohol/substance use, caffeine consumption, sleep pattern or metal implant status since previous tx. [employer] Tech informed pt of prior authorization update: [employer] Tech called clinical case manager Administrator) and did not receive an answer. Voicemail was left and marked 'urgent.' A more thorough update should be obtained and relayed by Monday (5/5).   Pt's treatment parameters are as follows: A/P -- 17 cm, SOA -- 34?degrees, Coil Angle -- ?0?degrees, Motor Threshold -- 1.11?SMT.?With these parameters, the pt will receive 36 sessions of TMS according to the following protocol: 3000 pulses per session, with stimulation in bursts of pulses lasting 4 seconds at a frequency of 10 Hz, separated by 11 seconds of rest. TMS Tech adjusted pt in the chair and relayed titration schedule (starting higher than 105%). Pt agreed to plan. The treatment coil was moved and the first burst of pulses were applied at a reduced power of 110%. Issues with coil connection against pt's scalp were presented after the first pulse train. Pt's head shifted as TMS Tech adjusted coil, showing u/d measurement had moved. TMS Tech removed coil, fixed u/d head position to reflect 9.0 cm, and re-checked all other measurements/LLC. Once pt was realigned, TMS Tech continued the session, but decreased power back to 105% to ensure pt felt comfortable. No discomfort or complaints were reported by pt. Treatment power stayed at 105% for 4 minutes  before TMS Tech re-increased power back to 110%. No discomfort or pain was reported by the pt. Pt requested silence during session time, TMS Tech agreed. After 5 minutes at 110%, the treatment power was  increased to 115%. Pt confirmed tolerability with no discomfort, sharp pain, or twitching in hands or feet reported. Pt stayed at 115% for the remaining duration of session. During escort to main lobby area, no dizziness or lightheadedness was reported by pt or observed by [employer] Tech. Pt departed post-treatment with no concerns or complaints.

## 2023-05-18 ENCOUNTER — Other Ambulatory Visit (HOSPITAL_COMMUNITY): Attending: Psychiatry

## 2023-05-18 VITALS — BP 102/72 | HR 76 | Temp 98.3°F | Resp 16

## 2023-05-18 DIAGNOSIS — F331 Major depressive disorder, recurrent, moderate: Secondary | ICD-10-CM | POA: Insufficient documentation

## 2023-05-18 NOTE — Progress Notes (Signed)
 Patient reported to [facility] for session #25 of Repetitive Transcranial Magnetic Stimulation treatment for episode of major depressive disorder. Patient sent email to TMS Tech over the weekend letting them know of a difficult situation that may affect mood and potential attendance for the next two weeks (family member in hospice). Patient still presented with an appropriate affect, level mood, and denied any suicidal or homicidal ideations. Patient reported no change in alcohol/substance use, caffeine consumption, sleep pattern or metal implant status since previous tx. Weekly vital signs and PHQ-9 screening were taken by TMS Tech and read as follows: BP = 117/64 (sitting), pulse = 78, temperature = 98.3, respiratory rate = 16, PHQ = 14. Pt informed TMS Tech that they had exercised an hour before session (swimming), but wasn't sure if that was why BP was low. Pt did not appear pale or lethargic, no abnormal symptoms reported. [employer] Tech informed pt that a second reading of BP will be taken after session, pt agreed.    Pt's treatment parameters are as follows: A/P -- 17 cm, SOA -- 34?degrees, Coil Angle -- ?0?degrees, Motor Threshold -- 1.11?SMT.?With these parameters, the pt will receive 36 sessions of TMS according to the following protocol: 3000 pulses per session, with stimulation in bursts of pulses lasting 4 seconds at a frequency of 10 Hz, separated by 11 seconds of rest. TMS Tech adjusted pt in the chair and relayed titration schedule. Pt agreed to plan. The treatment coil was moved and the first burst of pulses were applied at a reduced power of 105%, as a warm-up due to weekend break. Minimal issues with coil connection were presented after the first pulse train. No discomfort or complaints were reported by pt. Treatment power stayed at 105% for 3 minutes before TMS Tech increased power to 110%. No discomfort or pain was reported by the pt. Pt requested silence  during session time, TMS Tech agreed. After 5 minutes at 110%, the treatment power was increased to 115%. Pt confirmed tolerability with no discomfort, sharp pain, or twitching in hands or feet reported. Pt stayed at 115% for the remaining duration of session (13.5 minutes). Upon conclusion, 2nd BP was taken with pt standing: BP = 102/72, pulse = 76. TMS Tech will relay BP readings to TMS providers. During escort to main lobby area, no dizziness or lightheadedness was reported by pt or observed by [employer] Tech. Pt departed post-treatment with no concerns or complaints.

## 2023-05-19 ENCOUNTER — Other Ambulatory Visit (HOSPITAL_BASED_OUTPATIENT_CLINIC_OR_DEPARTMENT_OTHER)

## 2023-05-19 DIAGNOSIS — F331 Major depressive disorder, recurrent, moderate: Secondary | ICD-10-CM | POA: Diagnosis not present

## 2023-05-19 NOTE — Progress Notes (Signed)
 Patient reported to [facility] for session #26 of Repetitive Transcranial Magnetic Stimulation treatment for episode of major depressive disorder. Patient presented with an appropriate affect, level but happy mood (greeted Tech with a smile), and denied any suicidal or homicidal ideations. Patient reported no change in alcohol/substance use, caffeine consumption, sleep pattern or metal implant status since previous tx. Pt brought paperwork that was sent to their home address by their insurance payer [facility]). Paperwork showed most recent changes to prior-authorization, where units for [id number] were changed from 34 to 35. [employer] Tech highlighted all important info for pt for reference, but vocalized that the 'place of service' was still listed incorrectly. TMS Tech has documentation showing attempts to correct CSR mistakes, and will reference it if pt needs assistance after completion of full tx.    Pt's treatment parameters are as follows: A/P -- 17 cm, SOA -- 34?degrees, Coil Angle -- ?0?degrees, Motor Threshold -- 1.11?SMT.?With these parameters, the pt will receive 36 sessions of TMS according to the following protocol: 3000 pulses per session, with stimulation in bursts of pulses lasting 4 seconds at a frequency of 10 Hz, separated by 11 seconds of rest. TMS Tech adjusted pt in the chair and relayed titration schedule. Pt agreed to plan. The treatment coil was moved and the first burst of pulses were applied at a reduced power of 105%, as a warm-up due to increased screening score. Pt confirmed feeling 'better and more awake" than they did the previous day. No discomfort or complaints were reported by pt during active pulse trains. Treatment power stayed at 105% for 3 minutes before TMS Tech increased power to 110%. No discomfort or pain was reported by the pt. Pt listened to multiple podcasts during the session and laughed intermittently. After 5.5 minutes at 110%, the  treatment power was increased to 115%. Pt confirmed tolerability with no discomfort, sharp pain, or twitching in hands or feet reported. Pt stayed at 115% for the remaining duration of session. During escort to main lobby area, no dizziness or lightheadedness was reported by pt or observed by [employer] Tech. Pt departed post-treatment with no concerns or complaints.

## 2023-05-20 ENCOUNTER — Encounter (HOSPITAL_COMMUNITY): Payer: Self-pay

## 2023-05-20 ENCOUNTER — Other Ambulatory Visit (HOSPITAL_COMMUNITY)

## 2023-05-22 ENCOUNTER — Other Ambulatory Visit (HOSPITAL_BASED_OUTPATIENT_CLINIC_OR_DEPARTMENT_OTHER)

## 2023-05-22 DIAGNOSIS — F331 Major depressive disorder, recurrent, moderate: Secondary | ICD-10-CM | POA: Diagnosis not present

## 2023-05-22 NOTE — Progress Notes (Signed)
 Patient reported to [facility] for session #27 of Repetitive Transcranial Magnetic Stimulation treatment for episode of major depressive disorder. Patient presented with an appropriate affect, level but happy mood (greeted Tech with a smile), and denied any suicidal or homicidal ideations. Patient reported no change in alcohol/substance use, caffeine consumption, sleep pattern or metal implant status since previous tx.     Pt's treatment parameters are as follows: A/P -- 17 cm, SOA -- 34?degrees, Coil Angle -- ?0?degrees, Motor Threshold -- 1.11?SMT.?With these parameters, the pt will receive 36 sessions of TMS according to the following protocol: 3000 pulses per session, with stimulation in bursts of pulses lasting 4 seconds at a frequency of 10 Hz, separated by 11 seconds of rest. TMS Tech adjusted pt in the chair and relayed titration schedule. Pt agreed to plan. The treatment coil was moved and the first burst of pulses were applied at a reduced power of 105%, as a warm-up due to early session time. No discomfort or complaints were reported by pt. Treatment power stayed at 105% for 6 minutes, as coil connection presented minor issues. TMS Tech then increased power to 110%. No discomfort or pain was reported by the pt. Pt listened to multiple podcasts during the session. After 4 minutes at 110%, the treatment power was increased to 115%. Pt confirmed tolerability with no discomfort, sharp pain, or twitching in hands or feet reported. Pt stayed at 115% for the remaining duration of session. During escort to main lobby area, no dizziness or lightheadedness was reported by pt or observed by [employer] Tech. Pt departed post-treatment with no concerns or complaints.

## 2023-05-25 ENCOUNTER — Ambulatory Visit (INDEPENDENT_AMBULATORY_CARE_PROVIDER_SITE_OTHER): Payer: 59 | Admitting: Clinical

## 2023-05-25 ENCOUNTER — Other Ambulatory Visit (HOSPITAL_BASED_OUTPATIENT_CLINIC_OR_DEPARTMENT_OTHER)

## 2023-05-25 VITALS — BP 110/78 | HR 71 | Temp 98.3°F | Resp 16

## 2023-05-25 DIAGNOSIS — F411 Generalized anxiety disorder: Secondary | ICD-10-CM

## 2023-05-25 DIAGNOSIS — F331 Major depressive disorder, recurrent, moderate: Secondary | ICD-10-CM | POA: Diagnosis not present

## 2023-05-25 DIAGNOSIS — F341 Dysthymic disorder: Secondary | ICD-10-CM

## 2023-05-25 NOTE — Progress Notes (Signed)
 Patient reported to [facility] for session #28 of Repetitive Transcranial Magnetic Stimulation treatment for episode of major depressive disorder. Patient presented with a decreased affect, but denied any suicidal or homicidal ideations. Patient reported no change in alcohol/substance use, caffeine consumption, sleep pattern or metal implant status since previous tx. Pt informed TMS Tech that they had just came from a psychotherapy appt, which explains decreased affect. Pt stated "therapy isn't fun" and requested a silent session for the day. [employer] Tech agreed to silent session outside of collecting vitals and treatment check-in's. Resident provider informed of affect and score, advised Tech to observe and provide comfort when needed. Weekly vital signs and PHQ-9 assessment were obtained and read as follows: BP= 110/74, pulse= 72, temperature= 98.3, respiratory rate= 16, PHQ= 16. Increased PHQ score more than likely results from heavy topics discussed with counselor. 2nd BP reading was taken after session with pt standing and read as follows: BP= 110/78, pulse= 71.    Pt's treatment parameters are as follows: A/P -- 17 cm, SOA -- 34?degrees, Coil Angle -- ?0?degrees, Motor Threshold -- 1.11?SMT.?With these parameters, the pt will receive 36 sessions of TMS according to the following protocol: 3000 pulses per session, with stimulation in bursts of pulses lasting 4 seconds at a frequency of 10 Hz, separated by 11 seconds of rest. TMS Tech adjusted pt in the chair and relayed titration schedule. Pt agreed to plan. The treatment coil was moved and the first burst of pulses were applied at a reduced power of 105%, as a warm-up due low affect and new haircut (short, almost buzzed). Pt was informed that due to haircut, pulse may feel slightly more intense since hair provides barrier. Pt acknowledge potential sensitivity. No discomfort or complaints were reported by pt after initial  titration. Treatment power stayed at 105% for 4 minutes; TMS Tech then increased power to 110%. No discomfort or pain was reported by the pt. After 5 minutes at 110%, the treatment power was increased to 115%. Pt confirmed tolerability with no discomfort, sharp pain, or twitching in hands or feet reported. Pt stayed at 115% for the remaining duration of session. After session concluded, 2nd collection of BP looked normal. During escort to main lobby area, no dizziness or lightheadedness was reported by pt or observed by TMS Tech.  Pt then departed post-treatment with no concerns or complaints.

## 2023-05-25 NOTE — Progress Notes (Signed)
 Time: 9:00 am-9:55 am CPT Code: 16109U Diagnosis Code: F41.1, F34.1  Jonathan [name] was seen in person for individual therapy. His mood has remained stable and he described feeling slightly more optimistic about the future. He is not sure whether this may be an effect of TMS, but continues to attend regularly. He reported having had two alcoholic drinks the night before, and expressed a desire to shift his relationship with alcohol such that he does not look to it to make him feel a certain way. Therapist engaged him in discussion of coming up with a menu of activities he can do when at a loss, as well as exploring his relationship with boredom. He is scheduled to be seen again in one month, and will reach out if he needs to be seen sooner.   Coping plan I commit to: Regular runs (every other day) Biking or swimming on days when not running Daily meditation Giving problems over to a higher power Think of three things I am grateful for every day One small positive step daily Focused listening to music daily Take medication as prescribed  Call referral options (TMS treatment, partial hospitalization, ketamine treatment, DBT group) Go to emergency room  Reach out to therapist (NOT IN AN EMERGENCY-in emergency I will go to ER)     Intake  Presenting Problem  Jonathan [name] shared that he has been in therapy since June 30, 1983. He has been working with Dr. Fidencio Hue for the past 15 years. Recently, he has been working to manage anxiety, perfectionism, grief over the loss of his parents, and issues in relationships. 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 His most recent episode of catatonic depression was in 2013-2016. 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 He and his wife moved to [place] in 06/29/01. He has been teaching at [employer] since 06/29/2001, and his wife obtained a doctorate in clarinette. His wife teaches Engineering geologist and information  studies at [employer]. Symptoms  Difficulty sleeping, perfectionism, difficulty coping when situations do not meet expectations, tendency toward cognitive rigidity, recurrent major depression including depressed mood that have occurred periodically since Caige's early childhood. These have sometimes included passive suicidal ideation which on one occasion included a plan while Tobiah was in graduate school. His plan at the time was to overdose on sleeping pills. He was able to call suicide prevention and did not act on these thoughts. He described the potential impact on his family as a major barrier to acting on suicidal ideation. Family of Origin   Isom's mother died in 06/30/14 at the age of 53. He described her mental health while alive as "horrendous," and shared suspicion that she may have experienced childhood abuse that resulted in her being sent away to a boarding school. Jerett's maternal uncle was schizophrenic and institutionalized for much of his life. [place] and his brother obtained power of attorney toward the end of her life. Toward the end of her life, she became convinced that a variety of inanimate objects were sending her message. Pearly has a brother who is three years younger. Biran's father passed away in 06-30-19. He described a good relationship with his brother and nephews.    No needs/concerns related to ethnicity reported when asked: Jonathan [name] reported that three of his four grandparents were [name] immigrants. Much of his upbringing was focused on [name] culture. He described himself as "recovering" from the [name]. He has taken Buddhist vows.  Medical Issues Tolan sees Dr. Viveca Grist for medication management. He is prescribed buproprion, citalopram. Sleep aids as needed. He described himself as healthy overall, with minimal medical issues.  Leisure Activities/Daily Functioning   Acencion enjoys crossword puzzles, reading, movies, cooking, walking outside, listening to music. He has not  spent as much time engaged in his leisure activities since the semester started at [employer].   Substance use Riaz reported that he does not drink alcohol during the semester, but occasionally has a glass of wine or beer over the summer. He does not smoke.  Legal Status   No Legal Problems  Other   General Behavior: cooperative  Attire: appropriate  Gait: normal  Motor Activity: normal  Stream of Thought - Productivity: spontaneous  Stream of thought - Progression: normal  Stream of thought - Language: normal  Emotional tone and reactions - Mood: normal  Emotional tone and reactions - Affect: appropriate  Mental trend/Content of thoughts - Perception: normal  Mental trend/Content of thoughts - Orientation: normal  Mental trend/Content of thoughts - Memory: normal  Mental trend/Content of thoughts - General knowledge: consistent with education  Insight: good  Judgment: good  Intelligence: average  Diagnostic Summary   Dysthmia, Generalized Anxiety Disorder         Treatment Plan Client Abilities/Strengths  Jonathan [name] is open to feedback in a therapeutic setting and has found constructive criticism helpful in the past. He has been engaged and motivated in therapy for several years.   Client Treatment Preferences:  He is open to virtual and in-person appointments.  Client Statement of Needs  Jonathan [name] is seeking therapy for emotional support and stress reduction in his relationship and day-to-day life.  Treatment Level   Dent typically benefits from therapy weekly to every two months, depending on the state of his mental health. Symptoms  Perfectionistic tendencies, occasional depressive episodes, anxiety related to a range of situations Problems Addressed  Goals 1. Naftuli would like to develop strategies and gain support in order to prevent severe depressive episodes.  Objective  Target Date: 09/27/2023 Frequency: 2 weeks  Progress: 50% Modality: Individual therapy  Related  Interventions Pleasant will be provided opportunities to discuss his experiences in session. Therapist will use CBT based strategies to help Itzel identify and disengage from maladaptive thoughts and behaviors. Therapist will incorporate behavior activation strategies as appropriate. Therapist will provide emotion regulation strategies, including meditation, mindfulness, and self-care, as appropriate.  Therapist will provide referrals for additional resources as appropriate.   Related Interventions Diagnosis Axis none  Dysthmia, generalized anxiety disorder   Axis none    Conditions For Discharge Achievement of treatment goals and objectives    Arlene Lacy, PhD               Arlene Lacy, PhD

## 2023-05-26 ENCOUNTER — Other Ambulatory Visit (HOSPITAL_COMMUNITY): Attending: Psychiatry

## 2023-05-26 DIAGNOSIS — F331 Major depressive disorder, recurrent, moderate: Secondary | ICD-10-CM | POA: Diagnosis present

## 2023-05-26 NOTE — Progress Notes (Signed)
 Patient reported to [facility] for session #29 of Repetitive Transcranial Magnetic Stimulation treatment for episode of major depressive disorder. Patient presented with a decreased affect, but denied any suicidal or homicidal ideations. Patient reported no change in alcohol/substance use, caffeine consumption, sleep pattern or metal implant status since previous tx. Pt informed TMS Tech that they were "feeling better" than the previous day. Pt exhibited decreased dysphoria and greeted Tech with a smile.   Pt's treatment parameters are as follows: A/P -- 17 cm, SOA -- 34?degrees, Coil Angle -- ?0?degrees, Motor Threshold -- 1.11?SMT.?With these parameters, the pt will receive 36 sessions of TMS according to the following protocol: 3000 pulses per session, with stimulation in bursts of pulses lasting 4 seconds at a frequency of 10 Hz, separated by 11 seconds of rest. TMS Tech adjusted pt in the chair and relayed titration schedule. Pt agreed to plan of starting at a higher percentage. The treatment coil was moved and the first burst of pulses were applied at a reduced power of 110%. Slight issues with coil connection presented during session. TMS Tech did not modify measurements, but placed additional pressure on coil. No discomfort or complaints were reported by pt. Treatment power stayed at 110% for 6 minutes; TMS Tech then increased power to 115%. Finger/hand twitching was observed by TMS Tech and reported by pt. TMS Tech modified coil angle to -5 degrees. Finger/hand twitching was still observed. TMS Tech returned coil angle back to 0 degrees, moved A/P position forward by 2 (16.8cm), and decreased power percentage. No sharp pain or discomfort was reported during the modifications. Twitching was not observed again until TMS Tech increased power back to 115%. After two pulse trains to confirm movement, TMS Tech reverted treatment power back to 110% and relayed to TMS  provider. TMS provider authorized treatment power remaining at 110% and explained potential dip period. No other changes to be made at this time. After session concluded, no dizziness, lightheadedness, or pain was reported by the pt. Pt then departed post-treatment with no concerns or complaints.

## 2023-05-27 ENCOUNTER — Other Ambulatory Visit (HOSPITAL_COMMUNITY): Attending: Psychiatry

## 2023-05-27 DIAGNOSIS — F331 Major depressive disorder, recurrent, moderate: Secondary | ICD-10-CM | POA: Insufficient documentation

## 2023-05-27 NOTE — Progress Notes (Signed)
 Patient reported to [facility] for session #30 of Repetitive Transcranial Magnetic Stimulation treatment for episode of major depressive disorder. Patient presented with a decreased affect, but denied any suicidal or homicidal ideations. Patient reported no change in alcohol/substance use, caffeine consumption, sleep pattern or metal implant status since previous tx. Pt informed TMS Tech that no abnormal symptoms appeared during the previous evening, and experienced continued increase in mood/motivation compared to beginning of week.   Pt's treatment parameters are as follows: A/P -- 17 cm, SOA -- 34?degrees, Coil Angle -- ?0?degrees, Motor Threshold -- 1.11?SMT.?With these parameters, the pt will receive 36 sessions of TMS according to the following protocol: 3000 pulses per session, with stimulation in bursts of pulses lasting 4 seconds at a frequency of 10 Hz, separated by 11 seconds of rest. TMS Tech adjusted pt in the chair and relayed titration schedule. Tech explained that due to hand/finger movement during previous session, will start lower and increase slowly to eliminate potential factors (e.g., tx power level, coil angle/placement, natural shifts in chair, etc). Pt agreed to plan. The treatment coil was moved and the first burst of pulses were applied at a reduced power of 100%. No discomfort or complaints were reported by pt. No twitching was observed or reported. Treatment power stayed at 100% for 3 minutes; TMS Tech then increased power to 105%. No discomfort or twitching was observed or reported. Pt listened to podcasts during the tx session. After 6 minutes, TMS Tech increased power to 110%. Pt continued to affirm they felt comfortable, no issues with pulse trains. Treatment power was increased again to 115% for final 5 minutes of session. No discomfort or twitching was observed or reported. [employer] Tech informed resident provider of session update. After  session concluded, no dizziness, lightheadedness, or pain was reported by the pt. Pt then departed post-treatment with no concerns or complaints.

## 2023-05-28 ENCOUNTER — Other Ambulatory Visit (HOSPITAL_BASED_OUTPATIENT_CLINIC_OR_DEPARTMENT_OTHER)

## 2023-05-28 DIAGNOSIS — F331 Major depressive disorder, recurrent, moderate: Secondary | ICD-10-CM | POA: Diagnosis not present

## 2023-05-28 NOTE — Progress Notes (Signed)
 Patient reported to [facility] for session #31 of Repetitive Transcranial Magnetic Stimulation treatment for episode of major depressive disorder. Patient presented with a decreased affect, but denied any suicidal or homicidal ideations. Patient reported no change in alcohol/substance use, caffeine consumption, sleep pattern or metal implant status since previous tx.    Pt's treatment parameters are as follows: A/P -- 17 cm, SOA -- 34?degrees, Coil Angle -- ?0?degrees, Motor Threshold -- 1.11?SMT.?With these parameters, the pt will receive 36 sessions of TMS according to the following protocol: 3000 pulses per session, with stimulation in bursts of pulses lasting 4 seconds at a frequency of 10 Hz, separated by 11 seconds of rest. TMS Tech adjusted pt in the chair and relayed titration schedule. Pt agreed to plan. The treatment coil was moved and the first burst of pulses were applied at a reduced power of 105%. No discomfort or complaints were reported by pt. No hand twitching was observed or reported. Treatment power stayed at 105% for 4 minutes; TMS Tech then increased power to 110%. Pt tolerated increase well. Pt listened to podcasts during the tx session. After 6 minutes, TMS Tech increased power to 115%. Pt continued to affirm they felt comfortable, no issues with pulse trains and felt no discomfort/twitching. After session concluded, no dizziness, lightheadedness, or pain was reported by the pt. Pt then departed post-treatment with no concerns or complaints.

## 2023-05-29 ENCOUNTER — Other Ambulatory Visit (HOSPITAL_COMMUNITY): Attending: Psychiatry

## 2023-05-29 DIAGNOSIS — F331 Major depressive disorder, recurrent, moderate: Secondary | ICD-10-CM

## 2023-05-29 NOTE — Progress Notes (Signed)
 Patient reported to [facility] for session #32 of Repetitive Transcranial Magnetic Stimulation treatment for episode of major depressive disorder. Patient presented with a decreased affect, but denied any suicidal or homicidal ideations. Patient reported no change in alcohol/substance use, caffeine consumption, sleep pattern or metal implant status since previous tx. Pt reported that family member who moved into Hospice had passed away yesterday. Pt did not appear upset/emotionally dysregulated, but confirmed they felt stable. Pt informed TMS Tech that they they would keep them updated if changes to remaining tapers was needed.    Pt's treatment parameters are as follows: A/P -- 17 cm, SOA -- 34?degrees, Coil Angle -- ?0?degrees, Motor Threshold -- 1.11?SMT.?With these parameters, the pt will receive 36 sessions of TMS according to the following protocol: 3000 pulses per session, with stimulation in bursts of pulses lasting 4 seconds at a frequency of 10 Hz, separated by 11 seconds of rest. TMS Tech adjusted pt in the chair and relayed titration schedule. Pt agreed to plan. The treatment coil was moved and the first burst of pulses were applied at a reduced power of 105%. No discomfort or complaints were reported by pt. No hand twitching was observed or reported. Treatment power stayed at 105% for 3 minutes; TMS Tech then increased power to 110%. Pt tolerated increase well. Pt listened to podcasts during the tx session. After 6 minutes, TMS Tech increased power to 115%. Pt continued to affirm they felt comfortable, no issues with pulse trains and felt no discomfort/twitching. After session concluded, no dizziness, lightheadedness, or pain was reported by the pt. Pt continued to exhibit a stable affect. Pt then departed post-treatment with no concerns or complaints.

## 2023-06-01 ENCOUNTER — Other Ambulatory Visit (HOSPITAL_COMMUNITY)

## 2023-06-01 VITALS — BP 98/77 | HR 69 | Temp 97.8°F | Resp 16

## 2023-06-01 DIAGNOSIS — F331 Major depressive disorder, recurrent, moderate: Secondary | ICD-10-CM

## 2023-06-01 NOTE — Progress Notes (Signed)
 Patient reported to [facility] for session #33 of Repetitive Transcranial Magnetic Stimulation treatment for episode of major depressive disorder. Patient presented with a decreased affect, but denied any suicidal or homicidal ideations. Patient reported no change in alcohol/substance use, caffeine consumption, sleep pattern or metal implant status since previous tx. Pt informed TMS Tech that the pre-scheduled tapered sessions will need to be rescheduled for the following week. Pt needs to travel out of state for a funeral of close family member. [employer] Tech agreed and quickly discussed new appt dates/times. Pt agreed to new times. Weekly vital signs and PHQ-9 screening were conducted and read as follows: BP=98/77, pulse= 59, temperature= 97.8, respiratory rate= 16, PHQ= 9.    Pt's treatment parameters are as follows: A/P -- 17 cm, SOA -- 34?degrees, Coil Angle -- ?0?degrees, Motor Threshold -- 1.11?SMT.?With these parameters, the pt will receive 36 sessions of TMS according to the following protocol: 3000 pulses per session, with stimulation in bursts of pulses lasting 4 seconds at a frequency of 10 Hz, separated by 11 seconds of rest. TMS Tech adjusted pt in the chair and relayed titration schedule. TMS Tech explained that pt would be starting higher due to increased acclimation and stable mood. Pt agreed to plan. The treatment coil was moved and the first burst of pulses were applied at a reduced power of 110%. No discomfort or complaints were reported by pt. No hand twitching was observed or reported. Treatment power stayed at 110% for 5 minutes; TMS Tech then increased power to 115%. Pt tolerated increase well. Pt listened to podcasts during the tx session. Pt remained at 115% for the remainder of session. Pt continued to affirm they felt comfortable, no issues with pulse trains and felt no discomfort/twitching. Slight difficulty with coil connection was exhibited during  session, but did not require additional modifications. After session concluded, no dizziness, lightheadedness, or pain was reported by the pt. Pt continued to exhibit a stable affect. Pt then departed post-treatment with no concerns or complaints.

## 2023-06-02 ENCOUNTER — Telehealth (HOSPITAL_COMMUNITY): Payer: Self-pay

## 2023-06-02 NOTE — Telephone Encounter (Signed)
 Pt emailed [employer] Coordinator requesting a change in appt time for 06/09/23 session. Pt asked if they could push time back to 11am since they will be traveling back from being out of state. [employer] coordinator agreed.

## 2023-06-04 ENCOUNTER — Other Ambulatory Visit (HOSPITAL_COMMUNITY)

## 2023-06-05 ENCOUNTER — Other Ambulatory Visit (HOSPITAL_COMMUNITY)

## 2023-06-09 ENCOUNTER — Other Ambulatory Visit (HOSPITAL_BASED_OUTPATIENT_CLINIC_OR_DEPARTMENT_OTHER)

## 2023-06-09 VITALS — BP 107/78 | HR 80 | Temp 98.1°F | Resp 20

## 2023-06-09 DIAGNOSIS — F331 Major depressive disorder, recurrent, moderate: Secondary | ICD-10-CM

## 2023-06-09 NOTE — Progress Notes (Signed)
 Patient reported to [facility] for session #34 of Repetitive Transcranial Magnetic Stimulation treatment for episode of major depressive disorder. Patient presented with a stable mood, and denied any homicidal ideations. Patient reported no change in alcohol/substance use, caffeine consumption, or metal implant status since previous tx. Pt informed TMS Tech passive SI was felt due to the acute stressors of low sleep quality/quantity, attending a funeral of a close family member, and saying goodbye to a long-distance friend at the airport prior to session. TMS Tech asked pt to rate their passive SI on a scale of 1-10 (10 being most severe) and pt stated they were at a 5. Pt informed TMS Tech that they had been relying on their spouse for support during their crying spells. [employer] Tech informed Resident provider of pt's status, along with increase of PHQ score of 12. Weekly vital signs and PHQ-9 screening were conducted and read as follows: BP=106/70, pulse= 70, temperature= 98.1 respiratory rate= 20, PHQ= 12.    Pt's treatment parameters are as follows: A/P -- 17 cm, SOA -- 34?degrees, Coil Angle -- ?0?degrees, Motor Threshold -- 1.11?SMT.?With these parameters, the pt will receive 36 sessions of TMS according to the following protocol: 3000 pulses per session, with stimulation in bursts of pulses lasting 4 seconds at a frequency of 10 Hz, separated by 11 seconds of rest. TMS Tech adjusted pt in the chair and relayed titration schedule. TMS Tech explained that pt would be starting lower due to breaks in between sessions, and lower mood. Pt agreed to plan. The treatment coil was moved and the first burst of pulses were applied at a reduced power of 105%. No discomfort or complaints were reported by pt. No hand twitching was observed or reported. Treatment power stayed at 105% for 5 minutes; TMS Tech then increased power to 110%. Pt tolerated increase well. Pt listened to  "Strange Animals" podcasts during the tx session, and laughed intermittently. Pt remained at 110% for 6 minutes before TMS Tech increased then to 115%. Pt remained at 115% for the remainder of session. Pt continued to affirm they felt comfortable, no issues with pulse trains and felt no discomfort/twitching. Resident Provider responded to [employer] Tech via text and phone call. Resident Provider gave verbal order to speak with pt's spouse to ensure/confirm awareness of pt's current mental state. Pt's spouse was not in attendance to session. [employer] Tech asked pt if they could re-check BP with pt standing to ensure no dips. During 2nd collection of vitals, TMS Tech asked pt if spouse was aware of low mood, pt said yes. Pt placed a call to spouse, call went to voicemail. Wife confirmed awareness of status via text message. [employer] tech asked pt if they were planning to bring wife to final session, and pt said they "hadn't thought of that." TMS Tech informed pt that it was their decision, but would love for spouse to see pt's progress/toleration of treatment. Pt continued to small talk during escort to main lobby area, and quickly left once entered. Pt did not exhibit crying, shakiness, or additional concerning behavior upon departure.

## 2023-06-10 ENCOUNTER — Other Ambulatory Visit (HOSPITAL_COMMUNITY): Attending: Psychiatry

## 2023-06-10 DIAGNOSIS — F331 Major depressive disorder, recurrent, moderate: Secondary | ICD-10-CM

## 2023-06-10 NOTE — Progress Notes (Signed)
 Patient reported alone to [facility] for the final session (#35) of Repetitive Transcranial Magnetic Stimulation treatment for episode of major depressive disorder. Patient presented with a stable mood, and denied any suicidal or homicidal ideations. Patient reported no change in alcohol/substance use, caffeine consumption, sleep pattern, or metal implant status since previous tx. Pt informed TMS Tech that mood felt "more stable" than it did the previous day, due to coping skills, social supports, better sleep, and time to process. [employer] Tech assured pt that saying goodbye to friendships/loved ones is another aspect of grieving, so it's okay/normal if mood varies. [employer] Tech emphasized that if pt started to have an increase in ruminating thoughts of SI, to please not hesitate to reach out to PCP, primary psychiatrist/counselor, or go to the ER. [employer] Tech also stated that it was okay to reach out to TMS team if need be as well.    Pt's treatment parameters are as follows: A/P -- 17 cm, SOA -- 34?degrees, Coil Angle -- ?0?degrees, Motor Threshold -- 1.11?SMT.?With these parameters, the pt will receive 36 sessions of TMS according to the following protocol: 3000 pulses per session, with stimulation in bursts of pulses lasting 4 seconds at a frequency of 10 Hz, separated by 11 seconds of rest. TMS Tech adjusted pt in the chair and relayed titration schedule. TMS Tech explained that pt would be starting slightly higher at starting titration, and if pt felt brave, they could end at full treatment power. Pt agreed to plan. TMS Tech also assured pt that if pulses felt too intense, could titrate back down if needed. The treatment coil was moved and the first burst of pulses were applied at a reduced power of 110%. No discomfort or complaints were reported by pt. No hand twitching was observed or reported. Treatment power stayed at 110% for 3 minutes, then increased power to 115%. Pt  tolerated increase well. Pt remained at 110% for 10 minutes before TMS Tech increased then to 120%. Pt remained at 120% for the last 5 minutes of session. Pt continued to affirm they felt comfortable, no issues with pulse trains and felt no discomfort/twitching. Upon conclusion, TMS Tech went over final steps of the treatment journey (f/u with PCP after one month, treatment survey, etc) and pt agreed. No dizziness, vertigo, or abnormal symptoms were reported or observed during escort to main lobby area. Pt then departed OP clinic with no concerns or complaints.

## 2023-06-22 ENCOUNTER — Ambulatory Visit (INDEPENDENT_AMBULATORY_CARE_PROVIDER_SITE_OTHER): Admitting: Clinical

## 2023-06-22 DIAGNOSIS — F341 Dysthymic disorder: Secondary | ICD-10-CM

## 2023-06-22 DIAGNOSIS — F411 Generalized anxiety disorder: Secondary | ICD-10-CM
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
## 2023-07-06 ENCOUNTER — Ambulatory Visit (INDEPENDENT_AMBULATORY_CARE_PROVIDER_SITE_OTHER): Admitting: Clinical

## 2023-07-06 DIAGNOSIS — F411 Generalized anxiety disorder: Secondary | ICD-10-CM

## 2023-07-06 DIAGNOSIS — F341 Dysthymic disorder: Secondary | ICD-10-CM | POA: Diagnosis not present
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
## 2023-07-20 ENCOUNTER — Ambulatory Visit (INDEPENDENT_AMBULATORY_CARE_PROVIDER_SITE_OTHER): Admitting: Clinical

## 2023-07-20 DIAGNOSIS — F341 Dysthymic disorder: Secondary | ICD-10-CM

## 2023-07-20 DIAGNOSIS — F411 Generalized anxiety disorder: Secondary | ICD-10-CM
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
## 2023-07-23 ENCOUNTER — Telehealth (HOSPITAL_COMMUNITY): Payer: Self-pay

## 2023-07-23 NOTE — Telephone Encounter (Signed)
 [employer] Coordinator received incoming email from pt stating that they had been paying the total of $25 listed on each TMS bill they have gotten so far, but got one for today (07/23/23) that was billed at $1100. Along with [place] saying that pt was not covered for the service in total. Coordinator responded and advised that the pt call their insurance payer, and let them know that we have documentation stating otherwise. Coordinator attached document that was faxed by [employer] on 05/13/23 that states approval for all three CPT codes, along with a short explanation for what pt should say to payer. Coordinator concluded email requesting that pt not hesitate to reach out for any additional assistance, questions, or potential 3-way call.

## 2023-07-24 ENCOUNTER — Telehealth (HOSPITAL_COMMUNITY): Payer: Self-pay

## 2023-07-24 NOTE — Telephone Encounter (Signed)
 Pt sent incoming email response to Coordinator. Pt stated they were working on Engineer, materials) today and will keep Coordinator updated. Coordinator responded to pt and let them know that Coordinator will be on PAL from 7/16-7/20. Coordinator provided primary [facility]-OP office number for any/all emergent needs.

## 2023-08-03 ENCOUNTER — Ambulatory Visit (INDEPENDENT_AMBULATORY_CARE_PROVIDER_SITE_OTHER): Admitting: Clinical

## 2023-08-03 DIAGNOSIS — F341 Dysthymic disorder: Secondary | ICD-10-CM | POA: Diagnosis not present

## 2023-08-03 DIAGNOSIS — F411 Generalized anxiety disorder: Secondary | ICD-10-CM
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
## 2023-08-17 ENCOUNTER — Ambulatory Visit (INDEPENDENT_AMBULATORY_CARE_PROVIDER_SITE_OTHER): Admitting: Clinical

## 2023-08-17 DIAGNOSIS — F411 Generalized anxiety disorder: Secondary | ICD-10-CM

## 2023-08-17 DIAGNOSIS — F341 Dysthymic disorder: Secondary | ICD-10-CM | POA: Diagnosis not present
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
## 2023-09-07 ENCOUNTER — Ambulatory Visit (INDEPENDENT_AMBULATORY_CARE_PROVIDER_SITE_OTHER): Admitting: Clinical

## 2023-09-07 DIAGNOSIS — F341 Dysthymic disorder: Secondary | ICD-10-CM

## 2023-09-07 DIAGNOSIS — F411 Generalized anxiety disorder: Secondary | ICD-10-CM
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
## 2023-09-28 ENCOUNTER — Ambulatory Visit: Admitting: Clinical

## 2023-09-28 DIAGNOSIS — F341 Dysthymic disorder: Secondary | ICD-10-CM

## 2023-09-28 DIAGNOSIS — F411 Generalized anxiety disorder: Secondary | ICD-10-CM | POA: Diagnosis not present
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
## 2023-10-12 ENCOUNTER — Ambulatory Visit: Admitting: Clinical

## 2023-10-12 DIAGNOSIS — F411 Generalized anxiety disorder: Secondary | ICD-10-CM | POA: Diagnosis not present

## 2023-10-12 DIAGNOSIS — F341 Dysthymic disorder: Secondary | ICD-10-CM
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
## 2023-10-26 ENCOUNTER — Ambulatory Visit (INDEPENDENT_AMBULATORY_CARE_PROVIDER_SITE_OTHER): Admitting: Clinical

## 2023-10-26 DIAGNOSIS — F411 Generalized anxiety disorder: Secondary | ICD-10-CM | POA: Diagnosis not present

## 2023-10-26 DIAGNOSIS — F341 Dysthymic disorder: Secondary | ICD-10-CM

## 2023-10-26 NOTE — Progress Notes (Signed)
 Time: 9:00 am-9:55 am CPT Code: 09162E Diagnosis Code: F41.1, F34.1  Tarquin was seen in person for individual therapy. He reported that he continues to do relatively well in terms of depressive symptoms, but has been struggling to complete tasks he knows help with his mood. Therapist engaged him in discussion of how to facilitate this, suggesting tying these tasks to relaxing and enjoyable activities he tends to default to. Therapist pointed out his tendency to criticize himself. He is scheduled to be seen again in two weeks.    Coping plan I commit to: Regular runs (every other day) Biking or swimming on days when not running Daily meditation Giving problems over to a higher power Think of three things I am grateful for every day One small positive step daily Focused listening to music daily Take medication as prescribed  Call referral options (TMS treatment, partial hospitalization, ketamine treatment, DBT group) Go to emergency room  Reach out to therapist (NOT IN AN EMERGENCY-in emergency I will go to ER)     Intake  Presenting Problem  Chrsitopher shared that he has been in therapy since 1983/12/04. He has been working with Dr. Rollene Das for the past 15 years. Recently, he has been working to manage anxiety, perfectionism, grief over the loss of his parents, and issues in relationships. 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 His most recent episode of catatonic depression was in 2013-2016. 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 He and his wife moved to [place] in 12/03/01. He has been teaching at [employer] since 12-03-2001, and his wife obtained a doctorate in clarinette. His wife teaches Engineering geologist and information studies at [employer]. Symptoms  Difficulty sleeping, perfectionism, difficulty coping when situations do not meet expectations, tendency toward cognitive rigidity, recurrent major depression including depressed mood  that have occurred periodically since Rourke's early childhood. These have sometimes included passive suicidal ideation which on one occasion included a plan while Ely was in graduate school. His plan at the time was to overdose on sleeping pills. He was able to call suicide prevention and did not act on these thoughts. He described the potential impact on his family as a major barrier to acting on suicidal ideation. Family of Origin   Adarsh's mother died in 2014/12/04 at the age of 30. He described her mental health while alive as "horrendous," and shared suspicion that she may have experienced childhood abuse that resulted in her being sent away to a boarding school. Wilfrido's maternal uncle was schizophrenic and institutionalized for much of his life. [name] and his brother obtained power of attorney toward the end of her life. Toward the end of her life, she became convinced that a variety of inanimate objects were sending her message. Aaiden has a brother who is three years younger. Savvas's father passed away in 12-04-19. He described a good relationship with his brother and nephews.    No needs/concerns related to ethnicity reported when asked: Molly reported that three of his four grandparents were [name] immigrants. Much of his upbringing was focused on [name] culture. He described himself as "recovering" from the [name]. He has taken Buddhist vows.    Medical Issues Leviticus sees Dr. Slater Mam for medication management. He is prescribed buproprion, citalopram. Sleep aids as needed. He described himself as healthy overall, with minimal medical issues.  Leisure Activities/Daily Functioning   [place] enjoys crossword puzzles, reading, movies, cooking, walking outside,  listening to music. He has not spent as much time engaged in his leisure activities since the semester started at [employer].   Substance use Nishaan reported that he does not drink alcohol during the semester, but occasionally has a glass of wine or  beer over the summer. He does not smoke.  Legal Status   No Legal Problems  Other   General Behavior: cooperative  Attire: appropriate  Gait: normal  Motor Activity: normal  Stream of Thought - Productivity: spontaneous  Stream of thought - Progression: normal  Stream of thought - Language: normal  Emotional tone and reactions - Mood: normal  Emotional tone and reactions - Affect: appropriate  Mental trend/Content of thoughts - Perception: normal  Mental trend/Content of thoughts - Orientation: normal  Mental trend/Content of thoughts - Memory: normal  Mental trend/Content of thoughts - General knowledge: consistent with education  Insight: good  Judgment: good  Intelligence: average  Diagnostic Summary   Dysthmia, Generalized Anxiety Disorder         Treatment Plan Client Abilities/Strengths  Braun is open to feedback in a therapeutic setting and has found constructive criticism helpful in the past. He has been engaged and motivated in therapy for several years.   Client Treatment Preferences:  He is open to virtual and in-person appointments.  Client Statement of Needs  Jess is seeking therapy for emotional support and stress reduction in his relationship and day-to-day life.  Treatment Level   Willy typically benefits from therapy weekly to every two months, depending on the state of his mental health. Symptoms  Perfectionistic tendencies, occasional depressive episodes, anxiety related to a range of situations Problems Addressed  Goals 1. Daimen would like to develop strategies and gain support in order to prevent severe depressive episodes.  Objective  Target Date: 09/26/2024 Frequency: 2 weeks  Progress: 50% Modality: Individual therapy  Related Interventions Yash will be provided opportunities to discuss his experiences in session. Therapist will use CBT based strategies to help Rankin identify and disengage from maladaptive thoughts and behaviors. Therapist will  incorporate behavior activation strategies as appropriate. Therapist will provide emotion regulation strategies, including meditation, mindfulness, and self-care, as appropriate.  Therapist will provide referrals for additional resources as appropriate.   Related Interventions Diagnosis Axis none  Dysthmia, generalized anxiety disorder   Axis none    Conditions For Discharge Achievement of treatment goals and objectives    Andriette LITTIE Ponto, PhD               Andriette LITTIE Ponto, PhD               Andriette LITTIE Ponto, PhD               Andriette LITTIE Ponto, PhD

## 2023-11-09 ENCOUNTER — Other Ambulatory Visit: Payer: Self-pay | Admitting: Medical Genetics

## 2023-11-09 ENCOUNTER — Ambulatory Visit (INDEPENDENT_AMBULATORY_CARE_PROVIDER_SITE_OTHER): Admitting: Clinical

## 2023-11-09 DIAGNOSIS — F341 Dysthymic disorder: Secondary | ICD-10-CM

## 2023-11-09 DIAGNOSIS — F411 Generalized anxiety disorder: Secondary | ICD-10-CM

## 2023-11-09 DIAGNOSIS — Z006 Encounter for examination for normal comparison and control in clinical research program: Secondary | ICD-10-CM

## 2023-11-09 NOTE — Progress Notes (Signed)
 Time: 9:00 am-9:55 am CPT Code: 09162E Diagnosis Code: F41.1, F34.1  Jonathan [name] was seen in person for individual therapy. He reported that he continues to do relatively well in terms of depressive symptoms, but has been having trouble falling asleep at night when he has something scheduled the following morning. Session focused on exploring this, connecting it to his abstract sense of justice. Therapist suggested exploring whether running and/or meditating before his first activity helps. He is scheduled to be seen again in two weeks.    Coping plan I commit to: Regular runs (every other day) Biking or swimming on days when not running Daily meditation Giving problems over to a higher power Think of three things I am grateful for every day One small positive step daily Focused listening to music daily Take medication as prescribed  Call referral options (TMS treatment, partial hospitalization, ketamine treatment, DBT group) Go to emergency room  Reach out to therapist (NOT IN AN EMERGENCY-in emergency I will go to ER)     Intake  Presenting Problem  Jonathan [name] shared that he has been in therapy since Dec 08, 1983. He has been working with Dr. Rollene Das for the past 15 years. Recently, he has been working to manage anxiety, perfectionism, grief over the loss of his parents, and issues in relationships. 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 His most recent episode of catatonic depression was in 2013-2016. 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 He has been teaching at [employer] since Dec 07, 2001, and his wife obtained a doctorate in clarinette. His wife teaches engineering geologist and information studies at [employer]. Symptoms  Difficulty sleeping, perfectionism, difficulty coping when situations do not meet expectations, tendency toward cognitive rigidity, recurrent major depression including depressed  mood that have occurred periodically since Jonathan [name]'s early childhood. These have sometimes included passive suicidal ideation which on one occasion included a plan while Jonathan [name] was in graduate school. His plan at the time was to overdose on sleeping pills. He was able to call suicide prevention and did not act on these thoughts. He described the potential impact on his family as a major barrier to acting on suicidal ideation. Family of Origin   Jonathan [name] mother died in 2014/12/08 at the age of 31. He described her mental health while alive as "horrendous," and shared suspicion that she may have experienced childhood abuse that resulted in her being sent away to a boarding school. Jonathan [name] was schizophrenic and institutionalized for much of his life. Jonathan [name] and his brother obtained power of attorney toward the end of her life. Toward the end of her life, she became convinced that a variety of inanimate objects were sending her message. Jonathan [name] has a brother who is three years younger. Jonathan [name] father passed away in 12/08/2019. He described a good relationship with his brother and nephews.    No needs/concerns related to ethnicity reported when asked: Jonathan [name] reported that three of his four grandparents were Italian immigrants. Much of his upbringing was focused on Italian American culture. He described himself as "recovering" from the [name]. He has taken Buddhist vows.    Medical Issues Jonathan [name] sees Dr. Slater [name] for medication management. He is prescribed buproprion, citalopram. Sleep aids as needed. He described himself as healthy overall, with minimal medical issues.  Leisure Activities/Daily Functioning   Jonathan [name] enjoys crossword puzzles, reading, movies, cooking, walking outside, listening to music.  He has not spent as much time engaged in his leisure activities since the semester started at [employer].   Substance use Jonathan [name] reported that he does not drink alcohol during the semester, but occasionally has a glass of wine  or beer over the summer. He does not smoke.  Legal Status   No Legal Problems  Other   General Behavior: cooperative  Attire: appropriate  Gait: normal  Motor Activity: normal  Stream of Thought - Productivity: spontaneous  Stream of thought - Progression: normal  Stream of thought - Language: normal  Emotional tone and reactions - Mood: normal  Emotional tone and reactions - Affect: appropriate  Mental trend/Content of thoughts - Perception: normal  Mental trend/Content of thoughts - Orientation: normal  Mental trend/Content of thoughts - Memory: normal  Mental trend/Content of thoughts - General knowledge: consistent with education  Insight: good  Judgment: good  Intelligence: average  Diagnostic Summary   Dysthmia, Generalized Anxiety Disorder         Treatment Plan Client Abilities/Strengths  Jonathan [name] is open to feedback in a therapeutic setting and has found constructive criticism helpful in the past. He has been engaged and motivated in therapy for several years.   Client Treatment Preferences:  He is open to virtual and in-person appointments.  Client Statement of Needs  Jonathan [name] is seeking therapy for emotional support and stress reduction in his relationship and day-to-day life.  Treatment Level   Jonathan [name] typically benefits from therapy weekly to every two months, depending on the state of his mental health. Symptoms  Perfectionistic tendencies, occasional depressive episodes, anxiety related to a range of situations Problems Addressed  Goals 1. Jonathan [name] would like to develop strategies and gain support in order to prevent severe depressive episodes.  Objective  Target Date: 09/26/2024 Frequency: 2 weeks  Progress: 50% Modality: Individual therapy  Related Interventions Jonathan [name] will be provided opportunities to discuss his experiences in session. Therapist will use CBT based strategies to help Jonathan [name] identify and disengage from maladaptive thoughts and behaviors. Therapist will  incorporate behavior activation strategies as appropriate. Therapist will provide emotion regulation strategies, including meditation, mindfulness, and self-care, as appropriate.  Therapist will provide referrals for additional resources as appropriate.   Related Interventions Diagnosis Axis none  Dysthmia, generalized anxiety disorder   Axis none    Conditions For Discharge Achievement of treatment goals and objectives   Jonathan LITTIE Ponto, PhD

## 2023-11-23 ENCOUNTER — Ambulatory Visit: Admitting: Clinical

## 2023-11-23 DIAGNOSIS — F341 Dysthymic disorder: Secondary | ICD-10-CM

## 2023-11-23 DIAGNOSIS — F411 Generalized anxiety disorder: Secondary | ICD-10-CM | POA: Diagnosis not present

## 2023-11-23 NOTE — Progress Notes (Signed)
 Time: 9:00 am-9:55 am CPT Code: 09162E Diagnosis Code: F41.1, F34.1  Jonathan [name] was seen in person for individual therapy. He reported no suicidal ideation and that depressive symptoms remain minimal. Session included review of data he had gathered about his sleep difficulties. Data indicated an increase in difficulties in the days leading up to a conference he attended, which he attributed to anxiety about the conference. Session focused on processing his experience of the conference, during which he won an award but reported he nearly declined it. Therapist pointed out evidence of success in his field, and suggested alternate perspectives. He is scheduled to be seen again in one month, and will reach out if he needs to be seen sooner. He reported no suicidal ideation in recent weeks and indicated he has a copy of his coping plan and is able to follow it if needed.    Coping plan I commit to: Regular runs (every other day) Biking or swimming on days when not running Daily meditation Giving problems over to a higher power Think of three things I am grateful for every day One small positive step daily Focused listening to music daily Take medication as prescribed  Call referral options (TMS treatment, partial hospitalization, ketamine treatment, DBT group) Go to emergency room  Reach out to therapist (NOT IN AN EMERGENCY-in emergency I will go to ER)     Intake  Presenting Problem  Jonathan [name] shared that he has been in therapy since Dec 02, 1983. He has been working with Dr. Rollene Das for the past 15 years. Recently, he has been working to manage anxiety, perfectionism, grief over the loss of his parents, and issues in relationships. 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 His most recent episode of catatonic depression was in 2013-2016. 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 He and his wife moved to [place] in 12-01-2001. He  has been teaching at [employer] since 12-01-2001, and his wife obtained a doctorate in clarinette. His wife teaches engineering geologist and information studies at [employer]. Symptoms  Difficulty sleeping, perfectionism, difficulty coping when situations do not meet expectations, tendency toward cognitive rigidity, recurrent major depression including depressed mood that have occurred periodically since Jonathan [name] early childhood. These have sometimes included passive suicidal ideation which on one occasion included a plan while Jonathan [name] was in graduate school. His plan at the time was to overdose on sleeping pills. He was able to call suicide prevention and did not act on these thoughts. He described the potential impact on his family as a major barrier to acting on suicidal ideation. Family of Origin   Jonathan [name] mother died in 12-02-14 at the age of 57. He described her mental health while alive as "horrendous," and shared suspicion that she may have experienced childhood abuse that resulted in her being sent away to a boarding school. Jonathan [name] maternal uncle was schizophrenic and institutionalized for much of his life. Jonathan [name] and his brother obtained power of attorney toward the end of her life. Toward the end of her life, she became convinced that a variety of inanimate objects were sending her message. Jonathan [name] has a brother who is three years younger. Jonathan [name] father passed away in December 02, 2019. He described a good relationship with his brother and nephews.    No needs/concerns related to ethnicity reported when asked: Jonathan [name] reported that three of his four grandparents were Italian immigrants. Much of his upbringing was focused on Italian American culture. He described himself  as "recovering" from the [name]. He has taken Buddhist vows.    Medical Issues [name] sees Dr. Slater [name] for medication management. He is prescribed buproprion, citalopram. Sleep aids as needed. He described himself as healthy overall, with minimal medical issues.  Leisure  Activities/Daily Functioning   Jonathan [name] enjoys crossword puzzles, reading, movies, cooking, walking outside, listening to music. He has not spent as much time engaged in his leisure activities since the semester started at [employer].   Substance use Jonathan [name] reported that he does not drink alcohol during the semester, but occasionally has a glass of wine or beer over the summer. He does not smoke.  Legal Status   No Legal Problems  Other   General Behavior: cooperative  Attire: appropriate  Gait: normal  Motor Activity: normal  Stream of Thought - Productivity: spontaneous  Stream of thought - Progression: normal  Stream of thought - Language: normal  Emotional tone and reactions - Mood: normal  Emotional tone and reactions - Affect: appropriate  Mental trend/Content of thoughts - Perception: normal  Mental trend/Content of thoughts - Orientation: normal  Mental trend/Content of thoughts - Memory: normal  Mental trend/Content of thoughts - General knowledge: consistent with education  Insight: good  Judgment: good  Intelligence: average  Diagnostic Summary   Dysthmia, Generalized Anxiety Disorder         Treatment Plan Client Abilities/Strengths  Jonathan [name] is open to feedback in a therapeutic setting and has found constructive criticism helpful in the past. He has been engaged and motivated in therapy for several years.   Client Treatment Preferences:  He is open to virtual and in-person appointments.  Client Statement of Needs  Jonathan [name] is seeking therapy for emotional support and stress reduction in his relationship and day-to-day life.  Treatment Level   Jonathan [name] typically benefits from therapy weekly to every two months, depending on the state of his mental health. Symptoms  Perfectionistic tendencies, occasional depressive episodes, anxiety related to a range of situations Problems Addressed  Goals 1. [name] would like to develop strategies and gain support in order to prevent severe depressive  episodes.  Objective  Target Date: 09/26/2024 Frequency: 2 weeks  Progress: 50% Modality: Individual therapy  Related Interventions Jonathan [name] will be provided opportunities to discuss his experiences in session. Therapist will use CBT based strategies to help Jonathan [name] identify and disengage from maladaptive thoughts and behaviors. Therapist will incorporate behavior activation strategies as appropriate. Therapist will provide emotion regulation strategies, including meditation, mindfulness, and self-care, as appropriate.  Therapist will provide referrals for additional resources as appropriate.   Related Interventions Diagnosis Axis none  Dysthmia, generalized anxiety disorder   Axis none    Conditions For Discharge Achievement of treatment goals and objectives   Andriette LITTIE Ponto, PhD               Andriette LITTIE Ponto, PhD

## 2023-12-07 ENCOUNTER — Ambulatory Visit: Admitting: Clinical

## 2023-12-07 LAB — GENECONNECT MOLECULAR SCREEN: Genetic Analysis Overall Interpretation: NEGATIVE

## 2023-12-21 ENCOUNTER — Ambulatory Visit (INDEPENDENT_AMBULATORY_CARE_PROVIDER_SITE_OTHER): Admitting: Clinical

## 2023-12-21 DIAGNOSIS — F341 Dysthymic disorder: Secondary | ICD-10-CM

## 2023-12-21 DIAGNOSIS — F411 Generalized anxiety disorder: Secondary | ICD-10-CM
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
## 2024-01-18 ENCOUNTER — Ambulatory Visit: Admitting: Clinical

## 2024-01-18 DIAGNOSIS — F411 Generalized anxiety disorder: Secondary | ICD-10-CM | POA: Diagnosis not present

## 2024-01-18 DIAGNOSIS — F341 Dysthymic disorder: Secondary | ICD-10-CM | POA: Diagnosis not present
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
## 2024-02-01 ENCOUNTER — Ambulatory Visit: Admitting: Clinical

## 2024-02-15 ENCOUNTER — Ambulatory Visit: Admitting: Clinical

## 2024-02-15 DIAGNOSIS — F341 Dysthymic disorder: Secondary | ICD-10-CM | POA: Diagnosis not present

## 2024-02-15 DIAGNOSIS — F411 Generalized anxiety disorder: Secondary | ICD-10-CM | POA: Diagnosis not present

## 2024-02-29 ENCOUNTER — Ambulatory Visit: Admitting: Clinical

## 2024-03-14 ENCOUNTER — Ambulatory Visit: Admitting: Clinical

## 2024-03-28 ENCOUNTER — Ambulatory Visit: Admitting: Clinical
